# Patient Record
Sex: Female | Born: 1965 | Race: White | Hispanic: No | Marital: Married | Smoking: Never smoker
Health system: Southern US, Community
[De-identification: ages and names within clinical notes are randomized; demographics above are authoritative.]

## PROBLEM LIST (undated history)

## (undated) DIAGNOSIS — L900 Lichen sclerosus et atrophicus: Secondary | ICD-10-CM

## (undated) DIAGNOSIS — R351 Nocturia: Secondary | ICD-10-CM

## (undated) DIAGNOSIS — M1990 Unspecified osteoarthritis, unspecified site: Secondary | ICD-10-CM

## (undated) DIAGNOSIS — R7303 Prediabetes: Secondary | ICD-10-CM

## (undated) DIAGNOSIS — K219 Gastro-esophageal reflux disease without esophagitis: Secondary | ICD-10-CM

## (undated) DIAGNOSIS — D649 Anemia, unspecified: Secondary | ICD-10-CM

## (undated) DIAGNOSIS — R7989 Other specified abnormal findings of blood chemistry: Secondary | ICD-10-CM

## (undated) DIAGNOSIS — L309 Dermatitis, unspecified: Secondary | ICD-10-CM

## (undated) DIAGNOSIS — T8859XA Other complications of anesthesia, initial encounter: Secondary | ICD-10-CM

## (undated) DIAGNOSIS — E785 Hyperlipidemia, unspecified: Secondary | ICD-10-CM

## (undated) DIAGNOSIS — R339 Retention of urine, unspecified: Secondary | ICD-10-CM

## (undated) HISTORY — DX: Dermatitis, unspecified: L30.9

## (undated) HISTORY — PX: UPPER GI ENDOSCOPY: SHX6162

## (undated) HISTORY — DX: Other specified abnormal findings of blood chemistry: R79.89

## (undated) HISTORY — DX: Anemia, unspecified: D64.9

## (undated) HISTORY — DX: Nocturia: R35.1

## (undated) HISTORY — DX: Retention of urine, unspecified: R33.9

## (undated) HISTORY — PX: DILATION AND CURETTAGE, DIAGNOSTIC / THERAPEUTIC: SUR384

## (undated) HISTORY — DX: Gastro-esophageal reflux disease without esophagitis: K21.9

## (undated) HISTORY — PX: ADENOIDECTOMY: SUR15

---

## 2010-06-04 ENCOUNTER — Ambulatory Visit: Payer: Self-pay

## 2011-02-20 ENCOUNTER — Ambulatory Visit: Payer: Self-pay | Admitting: Gastroenterology

## 2011-09-30 ENCOUNTER — Other Ambulatory Visit: Payer: Self-pay | Admitting: Family

## 2011-10-30 ENCOUNTER — Ambulatory Visit: Payer: Self-pay | Admitting: Gastroenterology

## 2011-12-02 ENCOUNTER — Ambulatory Visit: Payer: Self-pay | Admitting: Gastroenterology

## 2011-12-03 LAB — PATHOLOGY REPORT

## 2012-01-13 HISTORY — PX: OTHER SURGICAL HISTORY: SHX169

## 2012-05-31 DIAGNOSIS — R339 Retention of urine, unspecified: Secondary | ICD-10-CM | POA: Insufficient documentation

## 2012-05-31 DIAGNOSIS — R3911 Hesitancy of micturition: Secondary | ICD-10-CM | POA: Insufficient documentation

## 2012-05-31 HISTORY — DX: Retention of urine, unspecified: R33.9

## 2012-09-02 ENCOUNTER — Ambulatory Visit: Payer: Self-pay | Admitting: Urology

## 2013-05-23 DIAGNOSIS — K219 Gastro-esophageal reflux disease without esophagitis: Secondary | ICD-10-CM | POA: Insufficient documentation

## 2013-05-23 DIAGNOSIS — D649 Anemia, unspecified: Secondary | ICD-10-CM | POA: Insufficient documentation

## 2013-05-23 DIAGNOSIS — L309 Dermatitis, unspecified: Secondary | ICD-10-CM | POA: Insufficient documentation

## 2013-05-23 HISTORY — DX: Anemia, unspecified: D64.9

## 2013-08-08 DIAGNOSIS — R351 Nocturia: Secondary | ICD-10-CM | POA: Insufficient documentation

## 2013-08-08 HISTORY — DX: Nocturia: R35.1

## 2013-09-05 ENCOUNTER — Ambulatory Visit: Payer: Self-pay | Admitting: Gastroenterology

## 2013-10-17 DIAGNOSIS — K219 Gastro-esophageal reflux disease without esophagitis: Secondary | ICD-10-CM | POA: Insufficient documentation

## 2014-01-12 HISTORY — PX: COLONOSCOPY: SHX174

## 2014-05-04 NOTE — Op Note (Signed)
PATIENT NAME:  Charlotte Conrad, STAMMEN MR#:  [id number] DATE OF BIRTH:  Dec 27, 1965  DATE OF PROCEDURE:  09/02/2012  PREOPERATIVE DIAGNOSIS: Obstructive voiding symptoms.   POSTOPERATIVE DIAGNOSIS: Obstructive voiding symptoms.   PROCEDURE: Urethral dilation/cystoscopy.   SURGEON: Eileene Kisling C. Bernardo Heater, M.D.   ASSISTANT: None.   ANESTHESIA: General.   INDICATIONS: This is a 49 year old female seen approximately 1 year ago with urinary hesitancy, decreased force stream and sensation of incomplete emptying. She was initially tried on tamsulosin with improvement in her symptoms. Subsequent cystoscopy was performed which showed no significant abnormalities, although after cystoscopy she was able to void much better and discontinue the tamsulosin. She recently returned with recurrent symptoms. She was given a trial on tamsulosin which did not help her symptoms. She presents for urethral dilation and cystoscopy and has requested this be performed under anesthesia.   DESCRIPTION OF PROCEDURE: The patient was taken to the operating room where a general anesthetic was administered. She was placed in the low lithotomy position and her external genitalia were prepped and draped in the usual fashion. Timeout was performed per protocol with all in agreement. The urethra was tied to a [employer] sound. The urethra subsequently dilated to 26-French without difficulty. A 21-French cystoscope sheath with obturator was lubricated and passed per urethra. Panendoscopy was performed. Bladder mucosa was normal in appearance without erythema, solid or papillary lesions. The ureteral orifices were normal-appearing with clear efflux. There was slight oozing of the urethra noted, but no significant bleeding. The bladder was emptied, and the cystoscope was removed. The patient was taken to the PAC-U in stable condition. There were no complications. EBL minimal. ____________________________ Ronda Fairly. Bernardo Heater,  MD scs:sb D: 09/02/2012 08:10:53 ET T: 09/02/2012 08:44:46 ET JOB#: [id number]  cc: Nicki Reaper C. Bernardo Heater, MD, <Dictator> Abbie Sons MD ELECTRONICALLY SIGNED 09/14/2012 11:14

## 2014-05-08 ENCOUNTER — Ambulatory Visit
Admit: 2014-05-08 | Disposition: A | Discharge status: Home / Self Care | Payer: Self-pay | Attending: Gastroenterology | Admitting: Gastroenterology

## 2014-10-24 DIAGNOSIS — L309 Dermatitis, unspecified: Secondary | ICD-10-CM

## 2014-10-24 DIAGNOSIS — R7989 Other specified abnormal findings of blood chemistry: Secondary | ICD-10-CM

## 2014-10-24 HISTORY — DX: Dermatitis, unspecified: L30.9

## 2014-10-24 HISTORY — DX: Other specified abnormal findings of blood chemistry: R79.89

## 2014-12-10 ENCOUNTER — Other Ambulatory Visit: Payer: Self-pay | Admitting: Gastroenterology

## 2015-05-11 IMAGING — RF DG UGI W/O KUB
8 of 10 series · 14 of 24 positions shown · non-contrast
Comparison: None.

CLINICAL DATA: Reflux

EXAM:
UPPER GI SERIES WITHOUT KUB
TECHNIQUE: Routine upper GI series was performed with thin and thick barium.
FLUOROSCOPY TIME:  54 seconds

[Series 1: fluoro_barium 2fps_bw · 0.17mm/px · 2 of 12 frames shown (1 of 7)]
[frame 2/12]
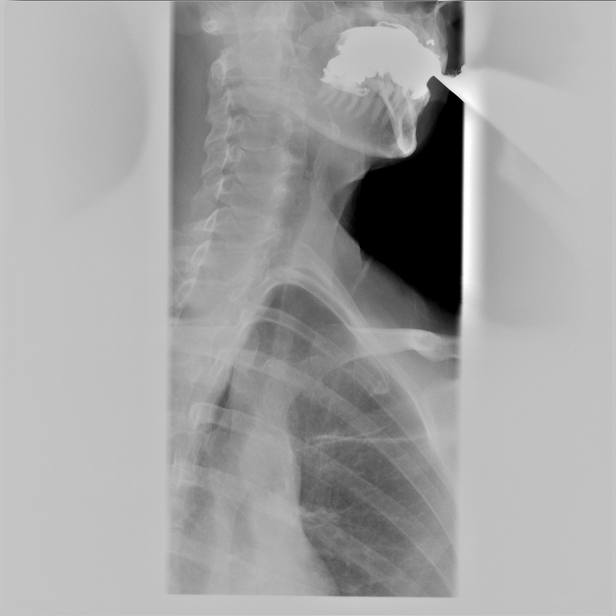
[frame 11/12]
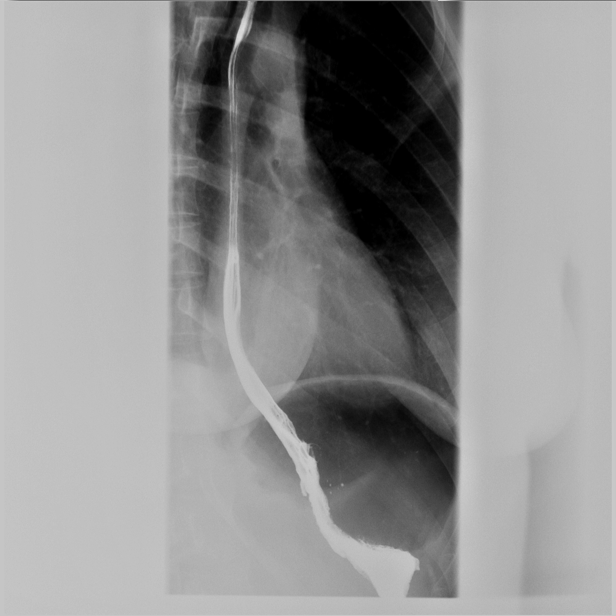

[Series 2: fluoro_barium 2fps_bw · 0.17mm/px · 1 of 17 frames shown (2 of 7)]
[frame 15/17]
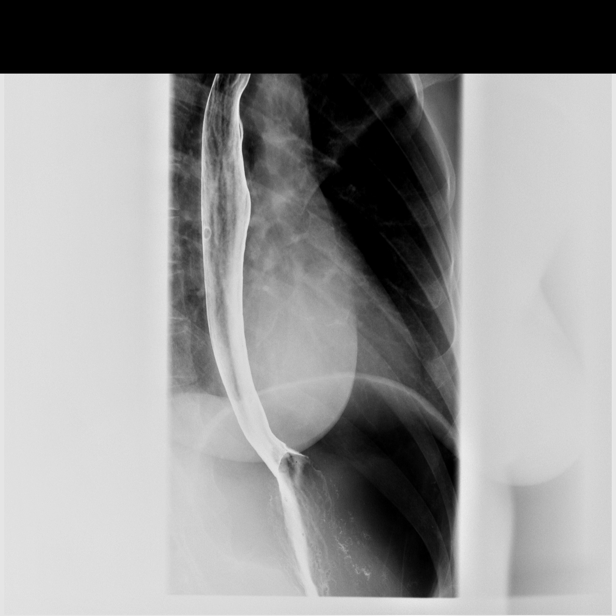

[Series 3: cp_standard · 0.26mm/px · 5 of 9 slices shown]
[im 1/9]
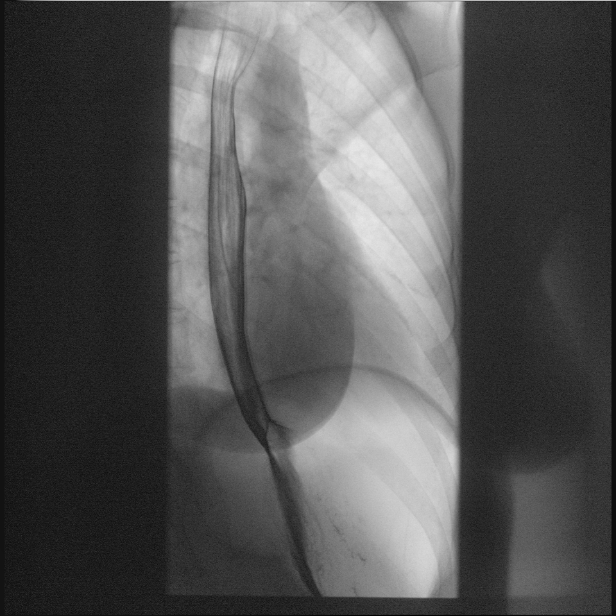
[im 2/9]
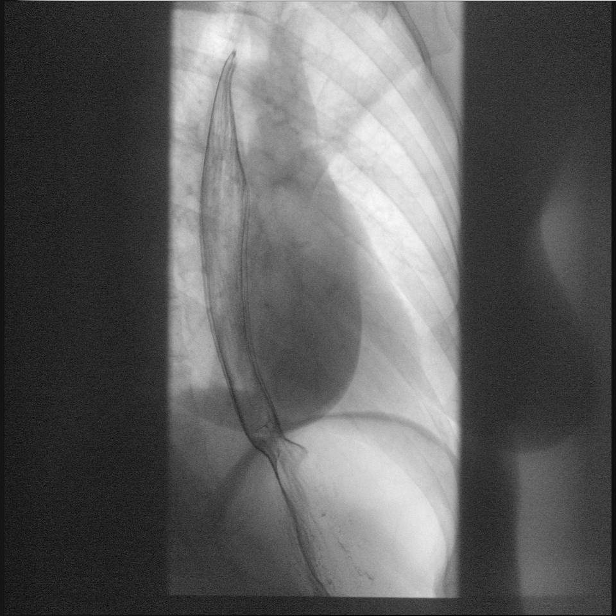
[im 4/9]
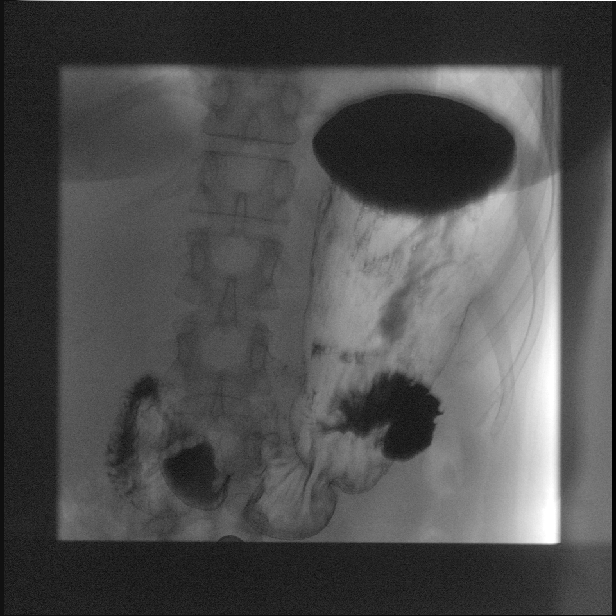
[im 6/9]
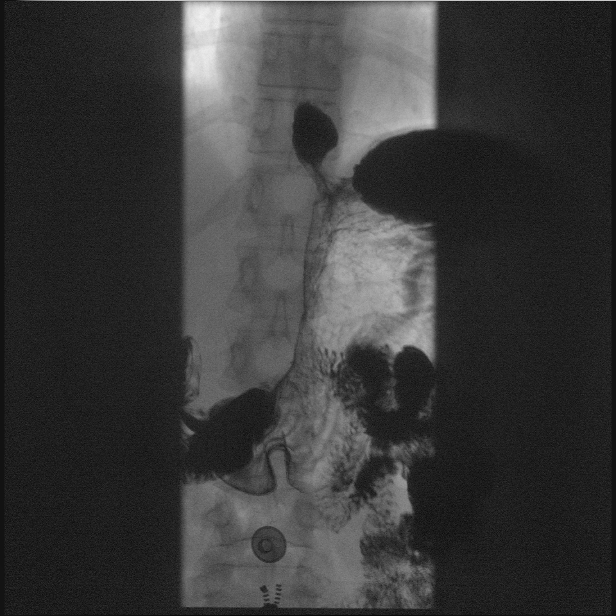
[im 8/9]
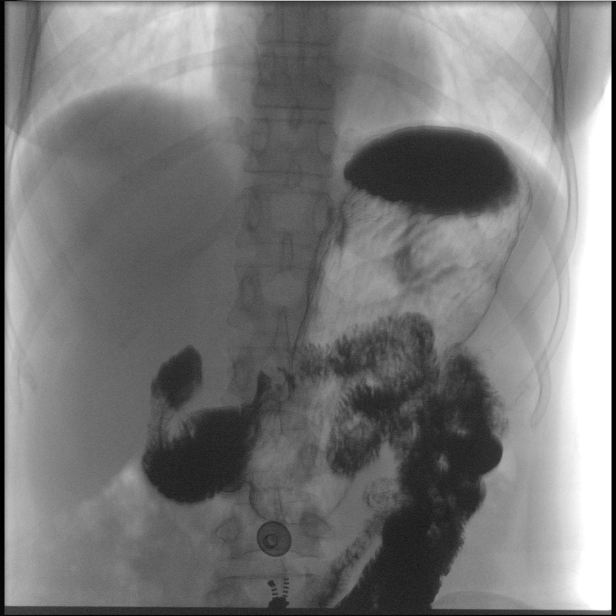

[Series 7: fluoro_barium 2fps_bw · 0.18mm/px · 1 of 1 slices shown (3 of 7)]
[im 1/1]
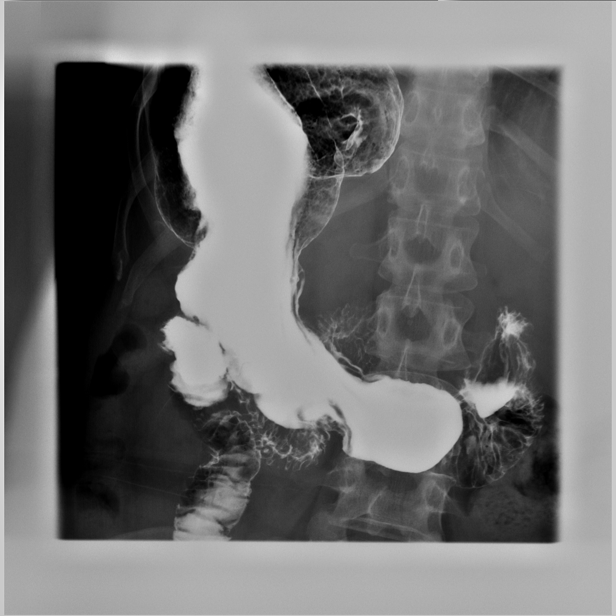

[Series 9: fluoro_barium 2fps_bw · 0.18mm/px · 1 of 1 slices shown (4 of 7)]
[im 1/1]
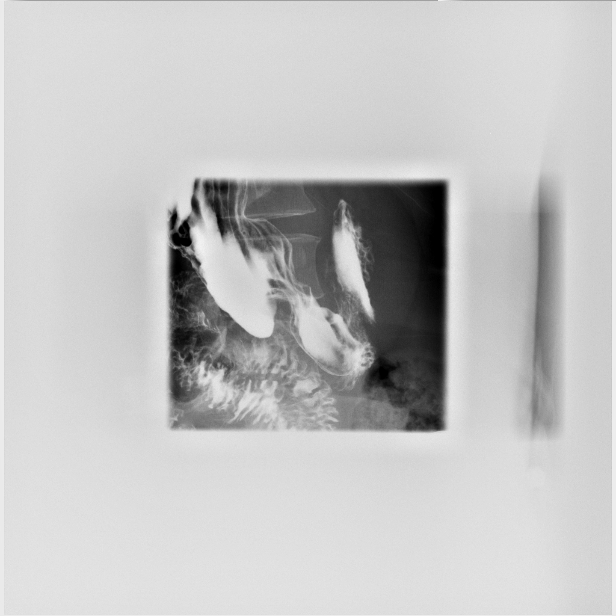

[Series 11: fluoro_barium 2fps_bw · 0.18mm/px · 1 of 1 slices shown (5 of 7)]
[im 1/1]
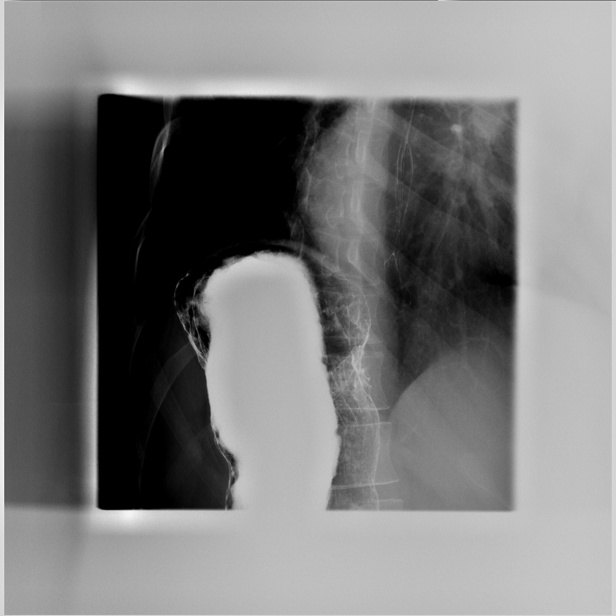

[Series 12: fluoro_barium 2fps_bw · 0.18mm/px · 1 of 1 slices shown (6 of 7)]
[im 1/1]
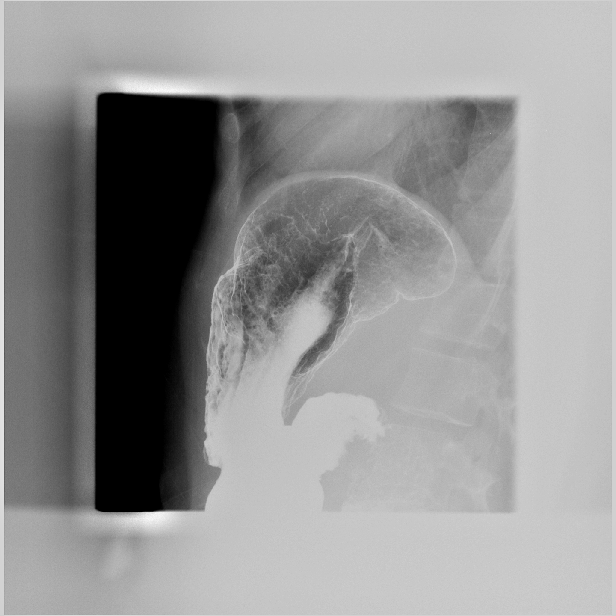

[Series 15: fluoro_barium 2fps_bw · 0.18mm/px · 2 of 16 frames shown (7 of 7)]
[frame 5/16]
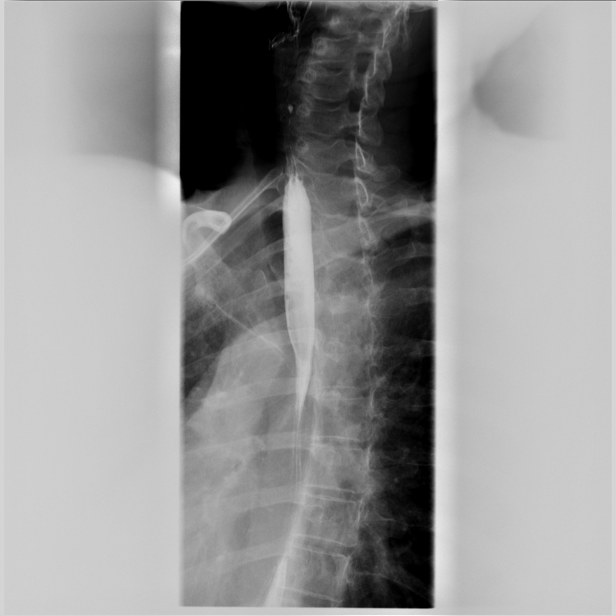
[frame 14/16]
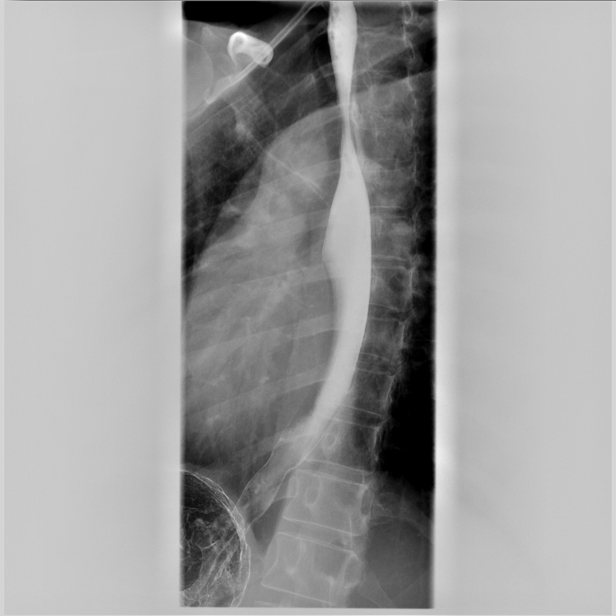

[14 of 24 positions shown; findings below may reference images not displayed]

FINDINGS: Examination of the esophagus demonstrated normal esophageal
motility. Normal esophageal morphology without evidence of
esophagitis or ulceration. No esophageal stricture, diverticula, or
mass lesion. No evidence of hiatal hernia. There is mild spontaneous
gastroesophageal reflux.

Examination of the stomach demonstrated normal rugal folds and areae
gastricae. The gastric mucosa appeared unremarkable without evidence
of ulceration, scarring, or mass lesion. Gastric motility and
emptying was normal. Fluoroscopic examination of the duodenum
demonstrates normal motility and morphology without evidence of
ulceration or mass lesion.

At the end of the examination a 13 mm barium tablet was administered
which transited through the esophagus and esophagogastric junction
without delay.
IMPRESSION: 1. Mild gastroesophageal reflux.  Otherwise unremarkable upper GI.

## 2015-11-11 ENCOUNTER — Other Ambulatory Visit: Payer: Self-pay

## 2015-11-12 ENCOUNTER — Ambulatory Visit (INDEPENDENT_AMBULATORY_CARE_PROVIDER_SITE_OTHER): Payer: BC Managed Care – PPO | Admitting: Gastroenterology

## 2015-11-12 ENCOUNTER — Encounter: Payer: Self-pay | Admitting: Gastroenterology

## 2015-11-12 VITALS — BP 105/72 | HR 84 | Temp 98.1°F | Ht 64.5 in | Wt 112.8 lb

## 2015-11-12 DIAGNOSIS — K219 Gastro-esophageal reflux disease without esophagitis: Secondary | ICD-10-CM

## 2015-11-12 MED ORDER — DEXLANSOPRAZOLE 60 MG PO CPDR: 1.0000 | DELAYED_RELEASE_CAPSULE | Freq: Every day | ORAL | 11 refills | Status: DC

## 2015-11-12 NOTE — Patient Instructions (Signed)
You have been given a refill on your Dexilant 60mg  today. This has been sent to your pharmacy. Please let me know if you have any questions or concerns.

## 2015-11-12 NOTE — Progress Notes (Signed)
   Primary Care Physician: Glendon Axe, MD  Primary Gastroenterologist:  Dr. Lucilla Lame  Chief Complaint  Patient presents with  . Medication Refill    HPI: Charlotte Conrad is a 50 y.o. female here for follow-up of her reflux. The patient states she has been doing well on Dexilant. The patient states she has some acid breakthrough when she doesn't eat right. There is no report of any further dysphagia with her history of having esophageal dilation in the past by Dr. Candace Cruise. The patient has been doing well without any weight loss fevers chills nausea vomiting. The patient just needs a refill of her medications.  Current Outpatient Prescriptions  Medication Sig Dispense Refill  . Cranberry 500 MG CAPS Take by mouth.    . DEXILANT 60 MG capsule Take one capsule by mouth daily 30 capsule 11  . loratadine (CLARITIN) 10 MG tablet Take 10 mg by mouth.    . Loratadine 10 MG CAPS Take by mouth.    . Multiple Vitamin (MULTI-VITAMINS) TABS Take by mouth.    . norethindrone-ethinyl estradiol 1/35 (NORTREL 1/35, 28,) tablet     . pantoprazole (PROTONIX) 40 MG tablet Take 40 mg by mouth.    . tamsulosin (FLOMAX) 0.4 MG CAPS capsule TAKE ONE CAPSULE BY MOUTH DAILY    . triamcinolone cream (KENALOG) 0.1 % Apply topically.    . vitamin C (ASCORBIC ACID) 500 MG tablet Take 500 mg by mouth.    . fluticasone (FLONASE) 50 MCG/ACT nasal spray Place into the nose.    . ranitidine (ZANTAC) 300 MG capsule Take 300 mg by mouth.     No current facility-administered medications for this visit.     Allergies as of 11/12/2015 - Review Complete 11/12/2015  Allergen Reaction Noted  . Esomeprazole magnesium  08/08/2013  . Etodolac Other (See Comments) 04/01/2013  . Levofloxacin Other (See Comments) 05/27/2012  . Sulfa antibiotics  05/30/2012    ROS:  General: Negative for anorexia, weight loss, fever, chills, fatigue, weakness. ENT: Negative for hoarseness, difficulty swallowing , nasal congestion. CV:  Negative for chest pain, angina, palpitations, dyspnea on exertion, peripheral edema.  Respiratory: Negative for dyspnea at rest, dyspnea on exertion, cough, sputum, wheezing.  GI: See history of present illness. GU:  Negative for dysuria, hematuria, urinary incontinence, urinary frequency, nocturnal urination.  Endo: Negative for unusual weight change.    Physical Examination:   BP 105/72   Pulse 84   Temp 98.1 F (36.7 C) (Oral)   Ht 5' 4.5" (1.638 m)   Wt 112 lb 12.8 oz (51.2 kg)   BMI 19.06 kg/m   General: Well-nourished, well-developed in no acute distress.  Eyes: No icterus. Conjunctivae pink. Neuro: Alert and oriented x 3.  Grossly intact. Skin: Warm and dry, no jaundice.   Psych: Alert and cooperative, normal mood and affect.  Labs:    Imaging Studies: No results found.  Assessment and Plan:   Charlotte Conrad is a 50 y.o. y/o female who comes in today with no new symptoms and has been doing well on Dexilant. The patient will be given refills for the next year of her Dexilant. The patient is also been told to contact me if any further symptoms change. The patient has been explained the plan and agrees with it.   Note: This dictation was prepared with Dragon dictation along with smaller phrase technology. Any transcriptional errors that result from this process are unintentional.

## 2016-08-11 ENCOUNTER — Other Ambulatory Visit: Payer: Self-pay

## 2016-08-11 ENCOUNTER — Telehealth: Payer: Self-pay | Admitting: Gastroenterology

## 2016-08-11 DIAGNOSIS — R1310 Dysphagia, unspecified: Secondary | ICD-10-CM

## 2016-08-11 DIAGNOSIS — R1319 Other dysphagia: Secondary | ICD-10-CM

## 2016-08-11 NOTE — Telephone Encounter (Signed)
Patient left a voice message that she is having trouble swallowing. Had EGD in 2016. Please call [phone]

## 2016-08-12 NOTE — Telephone Encounter (Signed)
Pt has been scheduled for an EGD at [facility] on 08/27/16.

## 2016-08-17 ENCOUNTER — Encounter: Payer: Self-pay | Admitting: *Deleted

## 2016-08-24 NOTE — Discharge Instructions (Signed)
General Anesthesia, Adult, Care After °These instructions provide you with information about caring for yourself after your procedure. Your health care provider may also give you more specific instructions. Your treatment has been planned according to current medical practices, but problems sometimes occur. Call your health care provider if you have any problems or questions after your procedure. °What can I expect after the procedure? °After the procedure, it is common to have: °· Vomiting. °· A sore throat. °· Mental slowness. ° °It is common to feel: °· Nauseous. °· Cold or shivery. °· Sleepy. °· Tired. °· Sore or achy, even in parts of your body where you did not have surgery. ° °Follow these instructions at home: °For at least 24 hours after the procedure: °· Do not: °? Participate in activities where you could fall or become injured. °? Drive. °? Use heavy machinery. °? Drink alcohol. °? Take sleeping pills or medicines that cause drowsiness. °? Make important decisions or sign legal documents. °? Take care of children on your own. °· Rest. °Eating and drinking °· If you vomit, drink water, juice, or soup when you can drink without vomiting. °· Drink enough fluid to keep your urine clear or pale yellow. °· Make sure you have little or no nausea before eating solid foods. °· Follow the diet recommended by your health care provider. °General instructions °· Have a responsible adult stay with you until you are awake and alert. °· Return to your normal activities as told by your health care provider. Ask your health care provider what activities are safe for you. °· Take over-the-counter and prescription medicines only as told by your health care provider. °· If you smoke, do not smoke without supervision. °· Keep all follow-up visits as told by your health care provider. This is important. °Contact a health care provider if: °· You continue to have nausea or vomiting at home, and medicines are not helpful. °· You  cannot drink fluids or start eating again. °· You cannot urinate after 8-12 hours. °· You develop a skin rash. °· You have fever. °· You have increasing redness at the site of your procedure. °Get help right away if: °· You have difficulty breathing. °· You have chest pain. °· You have unexpected bleeding. °· You feel that you are having a life-threatening or urgent problem. °This information is not intended to replace advice given to you by your health care provider. Make sure you discuss any questions you have with your health care provider. °Document Released: 04/06/2000 Document Revised: 06/03/2015 Document Reviewed: 12/13/2014 °Elsevier Interactive Patient Education © 2018 Elsevier Inc. ° °

## 2016-08-27 ENCOUNTER — Ambulatory Visit: Payer: BC Managed Care – PPO | Admitting: Anesthesiology

## 2016-08-27 ENCOUNTER — Encounter
Admission: RE | Disposition: A | Discharge status: Home / Self Care | Payer: Self-pay | Source: Ambulatory Visit | Attending: Gastroenterology

## 2016-08-27 ENCOUNTER — Ambulatory Visit
Admission: RE | Admit: 2016-08-27 | Discharge: 2016-08-27 | Disposition: A | Discharge status: Home / Self Care | Payer: BC Managed Care – PPO | Source: Ambulatory Visit | Attending: Gastroenterology | Admitting: Gastroenterology

## 2016-08-27 DIAGNOSIS — K219 Gastro-esophageal reflux disease without esophagitis: Secondary | ICD-10-CM | POA: Insufficient documentation

## 2016-08-27 DIAGNOSIS — R1310 Dysphagia, unspecified: Secondary | ICD-10-CM | POA: Insufficient documentation

## 2016-08-27 HISTORY — PX: ESOPHAGEAL DILATION: SHX303

## 2016-08-27 HISTORY — PX: ESOPHAGOGASTRODUODENOSCOPY (EGD) WITH PROPOFOL: SHX5813

## 2016-08-27 SURGERY — ESOPHAGOGASTRODUODENOSCOPY (EGD) WITH PROPOFOL
Anesthesia: General | Wound class: Contaminated

## 2016-08-27 MED ORDER — ACETAMINOPHEN 160 MG/5ML PO SOLN: 325.0000 mg | ORAL | Status: DC | PRN

## 2016-08-27 MED ORDER — LACTATED RINGERS IV SOLN
INTRAVENOUS | Status: DC
  Administered 2016-08-27: 09:00:00 via INTRAVENOUS

## 2016-08-27 MED ORDER — GLYCOPYRROLATE 0.2 MG/ML IJ SOLN
INTRAMUSCULAR | Status: DC | PRN
  Administered 2016-08-27: 0.1 mg via INTRAVENOUS

## 2016-08-27 MED ORDER — PROPOFOL 10 MG/ML IV BOLUS
INTRAVENOUS | Status: DC | PRN
  Administered 2016-08-27: 50 mg via INTRAVENOUS
  Administered 2016-08-27: 30 mg via INTRAVENOUS
  Administered 2016-08-27 (×3): 50 mg via INTRAVENOUS

## 2016-08-27 MED ORDER — LIDOCAINE HCL (CARDIAC) 20 MG/ML IV SOLN
INTRAVENOUS | Status: DC | PRN
  Administered 2016-08-27: 10 mg via INTRAVENOUS

## 2016-08-27 MED ORDER — STERILE WATER FOR IRRIGATION IR SOLN
Status: DC | PRN
  Administered 2016-08-27: 10:00:00

## 2016-08-27 MED ORDER — ACETAMINOPHEN 325 MG PO TABS: 325.0000 mg | ORAL_TABLET | ORAL | Status: DC | PRN

## 2016-08-27 SURGICAL SUPPLY — 32 items
BALLN DILATOR 10-12 8 (BALLOONS)
BALLN DILATOR 12-15 8 (BALLOONS)
BALLN DILATOR 15-18 8 (BALLOONS) ×3
BALLN DILATOR CRE 0-12 8 (BALLOONS)
BALLN DILATOR ESOPH 8 10 CRE (MISCELLANEOUS) IMPLANT
BALLOON DILATOR 12-15 8 (BALLOONS) IMPLANT
BALLOON DILATOR 15-18 8 (BALLOONS) ×1 IMPLANT
BALLOON DILATOR CRE 0-12 8 (BALLOONS) IMPLANT
BLOCK BITE 60FR ADLT L/F GRN (MISCELLANEOUS) ×3 IMPLANT
CANISTER SUCT 1200ML W/VALVE (MISCELLANEOUS) ×3 IMPLANT
CLIP HMST 235XBRD CATH ROT (MISCELLANEOUS) IMPLANT
CLIP RESOLUTION 360 11X235 (MISCELLANEOUS)
FCP ESCP3.2XJMB 240X2.8X (MISCELLANEOUS)
FORCEPS BIOP RAD 4 LRG CAP 4 (CUTTING FORCEPS) IMPLANT
FORCEPS BIOP RJ4 240 W/NDL (MISCELLANEOUS)
FORCEPS ESCP3.2XJMB 240X2.8X (MISCELLANEOUS) IMPLANT
GOWN CVR UNV OPN BCK APRN NK (MISCELLANEOUS) ×2 IMPLANT
GOWN ISOL THUMB LOOP REG UNIV (MISCELLANEOUS) ×4
INJECTOR VARIJECT VIN23 (MISCELLANEOUS) IMPLANT
KIT DEFENDO VALVE AND CONN (KITS) IMPLANT
KIT ENDO PROCEDURE OLY (KITS) ×3 IMPLANT
MARKER SPOT ENDO TATTOO 5ML (MISCELLANEOUS) IMPLANT
PAD GROUND ADULT SPLIT (MISCELLANEOUS) IMPLANT
RETRIEVER NET PLAT FOOD (MISCELLANEOUS) IMPLANT
SNARE SHORT THROW 13M SML OVAL (MISCELLANEOUS) IMPLANT
SNARE SHORT THROW 30M LRG OVAL (MISCELLANEOUS) IMPLANT
SPOT EX ENDOSCOPIC TATTOO (MISCELLANEOUS)
SYR INFLATION 60ML (SYRINGE) ×3 IMPLANT
TRAP ETRAP POLY (MISCELLANEOUS) IMPLANT
VARIJECT INJECTOR VIN23 (MISCELLANEOUS)
WATER STERILE IRR 250ML POUR (IV SOLUTION) ×3 IMPLANT
WIRE CRE 18-20MM 8CM F G (MISCELLANEOUS) IMPLANT

## 2016-08-27 NOTE — Transfer of Care (Signed)
Immediate Anesthesia Transfer of Care Note  Patient: Charlotte Conrad  Procedure(s) Performed: Procedure(s): ESOPHAGOGASTRODUODENOSCOPY (EGD) WITH PROPOFOL (N/A) ESOPHAGEAL DILATION (N/A)  Patient Location: PACU  Anesthesia Type: General  Level of Consciousness: awake, alert  and patient cooperative  Airway and Oxygen Therapy: Patient Spontanous Breathing and Patient connected to supplemental oxygen  Post-op Assessment: Post-op Vital signs reviewed, Patient's Cardiovascular Status Stable, Respiratory Function Stable, Patent Airway and No signs of Nausea or vomiting  Post-op Vital Signs: Reviewed and stable  Complications: No apparent anesthesia complications

## 2016-08-27 NOTE — Anesthesia Postprocedure Evaluation (Signed)
Anesthesia Post Note  Patient: Charlotte Conrad  Procedure(s) Performed: Procedure(s) (LRB): ESOPHAGOGASTRODUODENOSCOPY (EGD) WITH PROPOFOL (N/A) ESOPHAGEAL DILATION (N/A)  Patient location during evaluation: PACU Anesthesia Type: General Level of consciousness: awake and alert and oriented Pain management: satisfactory to patient Vital Signs Assessment: post-procedure vital signs reviewed and stable Respiratory status: spontaneous breathing, nonlabored ventilation and respiratory function stable Cardiovascular status: blood pressure returned to baseline and stable Postop Assessment: Adequate PO intake and No signs of nausea or vomiting Anesthetic complications: no    Raliegh Ip

## 2016-08-27 NOTE — Op Note (Signed)
[facility] Gastroenterology Patient Name: Charlotte Conrad Procedure Date: 08/27/2016 9:31 AM MRN: [id number] Account #: [id number] Date of Birth: May 28, 1965 Admit Type: Outpatient Age: 51 Room: [facility] OR ROOM 01 Gender: Female Note Status: Finalized Procedure:            Upper GI endoscopy Indications:          Dysphagia Providers:            Lucilla Lame MD, MD Referring MD:         Glendon Axe (Referring MD) Medicines:            Propofol per Anesthesia Complications:        No immediate complications. Procedure:            Pre-Anesthesia Assessment:                       - Prior to the procedure, a History and Physical was                        performed, and patient medications and allergies were                        reviewed. The patient's tolerance of previous                        anesthesia was also reviewed. The risks and benefits of                        the procedure and the sedation options and risks were                        discussed with the patient. All questions were                        answered, and informed consent was obtained. Prior                        Anticoagulants: The patient has taken no previous                        anticoagulant or antiplatelet agents. ASA Grade                        Assessment: II - A patient with mild systemic disease.                        After reviewing the risks and benefits, the patient was                        deemed in satisfactory condition to undergo the                        procedure.                       After obtaining informed consent, the endoscope was                        passed under direct vision. Throughout the procedure,  the patient's blood pressure, pulse, and oxygen                        saturations were monitored continuously. The Olympus                        GIF H180J Endoscope (G#:[id number]) was introduced through                        the mouth,  and advanced to the second part of duodenum.                        The upper GI endoscopy was accomplished without                        difficulty. The patient tolerated the procedure well. Findings:      The examined esophagus was normal. Two biopsies were obtained in the       middle third of the esophagus with cold forceps for histology. A TTS       dilator was passed through the scope. Dilation with a 15-16.5-18 mm       balloon dilator was performed to 18 mm.      The stomach was normal.      The examined duodenum was normal. Impression:           - Normal esophagus. Dilated.                       - Normal stomach.                       - Normal examined duodenum.                       - Two biopsies were obtained in the middle third of the                        esophagus. Recommendation:       - Discharge patient to home.                       - Resume previous diet.                       - Continue present medications.                       - Await pathology results. Procedure Code(s):    --- Professional ---                       [phone], Esophagogastroduodenoscopy, flexible, transoral;                        with transendoscopic balloon dilation of esophagus                        (less than 30 mm diameter)                       43239, Esophagogastroduodenoscopy, flexible, transoral;                        with biopsy, single  or multiple Diagnosis Code(s):    --- Professional ---                       R13.10, Dysphagia, unspecified CPT copyright 2016 American Medical Association. All rights reserved. The codes documented in this report are preliminary and upon coder review may  be revised to meet current compliance requirements. Lucilla Lame MD, MD 08/27/2016 9:54:27 AM This report has been signed electronically. Number of Addenda: 0 Note Initiated On: 08/27/2016 9:31 AM      [facility]

## 2016-08-27 NOTE — Anesthesia Preprocedure Evaluation (Signed)
Anesthesia Evaluation  Patient identified by MRN, date of birth, ID band Patient awake    Reviewed: Allergy & Precautions, H&P , NPO status , Patient's Chart, lab work & pertinent test results  Airway Mallampati: II  TM Distance: >3 FB Neck ROM: full    Dental no notable dental hx.    Pulmonary    Pulmonary exam normal breath sounds clear to auscultation       Cardiovascular Normal cardiovascular exam Rhythm:regular Rate:Normal     Neuro/Psych    GI/Hepatic GERD  ,  Endo/Other    Renal/GU      Musculoskeletal   Abdominal   Peds  Hematology   Anesthesia Other Findings   Reproductive/Obstetrics                             Anesthesia Physical Anesthesia Plan  ASA: II  Anesthesia Plan: General   Post-op Pain Management:    Induction:   PONV Risk Score and Plan: 3 and Propofol infusion  Airway Management Planned:   Additional Equipment:   Intra-op Plan:   Post-operative Plan:   Informed Consent: I have reviewed the patients History and Physical, chart, labs and discussed the procedure including the risks, benefits and alternatives for the proposed anesthesia with the patient or authorized representative who has indicated his/her understanding and acceptance.     Plan Discussed with: CRNA  Anesthesia Plan Comments:         Anesthesia Quick Evaluation  

## 2016-08-27 NOTE — H&P (Signed)
Lucilla Lame, MD [address] [place], [place] [address] Phone:[phone] Fax : [phone]  Primary Care Physician:  Glendon Axe, MD Primary Gastroenterologist:  Dr. Allen Norris  Pre-Procedure History & Physical: HPI:  Charlotte Conrad is a 51 y.o. female is here for an endoscopy.   Past Medical History:  Diagnosis Date  . Anemia 05/23/2013  . Eczema 10/24/2014  . Elevated TSH 10/24/2014  . GERD (gastroesophageal reflux disease)   . Incomplete emptying of bladder 05/31/2012  . Nocturia 08/08/2013    Past Surgical History:  Procedure Laterality Date  . ADENOIDECTOMY    . DILATION AND CURETTAGE, DIAGNOSTIC / THERAPEUTIC    . UPPER GI ENDOSCOPY     02/01/2003, 02/19//2013, 12/02/2011    Prior to Admission medications   Medication Sig Start Date End Date Taking? Authorizing Provider  CALCIUM PO Take by mouth daily.   Yes [provider]  Cranberry 500 MG CAPS Take by mouth.   Yes [provider]  dexlansoprazole (DEXILANT) 60 MG capsule Take 1 capsule (60 mg total) by mouth daily. 11/12/15  Yes Lucilla Lame, MD  DOCUSATE SODIUM PO Take by mouth daily as needed.   Yes [provider]  loratadine (CLARITIN) 10 MG tablet Take 10 mg by mouth.   Yes [provider]  Multiple Vitamin (MULTI-VITAMINS) TABS Take by mouth.   Yes [provider]  norethindrone-ethinyl estradiol (JUNEL FE,GILDESS FE,LOESTRIN FE) 1-20 MG-MCG tablet Take 1 tablet by mouth daily.   Yes [provider]  triamcinolone cream (KENALOG) 0.1 % Apply topically.   Yes [provider]  vitamin C (ASCORBIC ACID) 500 MG tablet Take 500 mg by mouth.   Yes [provider]  fluticasone (FLONASE) 50 MCG/ACT nasal spray Place into the nose. 03/01/14   [provider]    Allergies as of 08/11/2016 - Review Complete 11/12/2015  Allergen Reaction Noted  . Esomeprazole magnesium  08/08/2013  . Etodolac Other (See Comments) 04/01/2013   . Levofloxacin Other (See Comments) 05/27/2012  . Sulfa antibiotics  05/30/2012    Family History  Problem Relation Age of Onset  . Hypertension Mother   . Cancer Mother   . Diabetes Mellitus II Father   . Melanoma Father   . Diabetes Sister   . Hypertension Sister   . Stroke Maternal Grandfather     Social History   Social History  . Marital status: Married    Spouse name: N/A  . Number of children: N/A  . Years of education: N/A   Occupational History  . Not on file.   Social History Main Topics  . Smoking status: Never Smoker  . Smokeless tobacco: Never Used  . Alcohol use No  . Drug use: No  . Sexual activity: Not on file   Other Topics Concern  . Not on file   Social History Narrative  . No narrative on file    Review of Systems: See HPI, otherwise negative ROS  Physical Exam: BP 121/82   Pulse 77   Resp 16   Ht 5' 4.5" (1.638 m)   Wt 112 lb (50.8 kg)   LMP 08/17/2016 (Exact Date) Comment: Preg Test Negative  SpO2 100%   BMI 18.93 kg/m  General:   Alert,  pleasant and cooperative in NAD Head:  Normocephalic and atraumatic. Neck:  Supple; no masses or thyromegaly. Lungs:  Clear throughout to auscultation.    Heart:  Regular rate and rhythm. Abdomen:  Soft, nontender and nondistended. Normal  bowel sounds, without guarding, and without rebound.   Neurologic:  Alert and  oriented x4;  grossly normal neurologically.  Impression/Plan: Charlotte Conrad is here for an endoscopy to be performed for dysphagia  Risks, benefits, limitations, and alternatives regarding  endoscopy have been reviewed with the patient.  Questions have been answered.  All parties agreeable.   Lucilla Lame, MD  08/27/2016, 9:35 AM

## 2016-08-28 ENCOUNTER — Encounter: Payer: Self-pay | Admitting: Gastroenterology

## 2016-08-29 ENCOUNTER — Encounter: Payer: Self-pay | Admitting: Gastroenterology

## 2016-10-27 ENCOUNTER — Other Ambulatory Visit: Payer: Self-pay | Admitting: Gastroenterology

## 2017-05-25 ENCOUNTER — Other Ambulatory Visit: Payer: Self-pay | Admitting: Gastroenterology

## 2017-09-08 ENCOUNTER — Encounter: Payer: Self-pay | Admitting: Gastroenterology

## 2017-09-08 ENCOUNTER — Ambulatory Visit: Payer: BC Managed Care – PPO | Admitting: Gastroenterology

## 2017-09-08 ENCOUNTER — Encounter (INDEPENDENT_AMBULATORY_CARE_PROVIDER_SITE_OTHER): Payer: Self-pay

## 2017-09-08 VITALS — BP 116/69 | HR 77 | Ht 64.5 in | Wt 112.0 lb

## 2017-09-08 DIAGNOSIS — R350 Frequency of micturition: Secondary | ICD-10-CM | POA: Insufficient documentation

## 2017-09-08 DIAGNOSIS — K219 Gastro-esophageal reflux disease without esophagitis: Secondary | ICD-10-CM | POA: Diagnosis not present

## 2017-09-08 DIAGNOSIS — D259 Leiomyoma of uterus, unspecified: Secondary | ICD-10-CM | POA: Insufficient documentation

## 2017-09-08 DIAGNOSIS — R87619 Unspecified abnormal cytological findings in specimens from cervix uteri: Secondary | ICD-10-CM | POA: Insufficient documentation

## 2017-09-08 DIAGNOSIS — N390 Urinary tract infection, site not specified: Secondary | ICD-10-CM | POA: Insufficient documentation

## 2017-09-08 MED ORDER — DEXLANSOPRAZOLE 60 MG PO CPDR: 1.0000 | DELAYED_RELEASE_CAPSULE | Freq: Every day | ORAL | 11 refills | Status: DC

## 2017-09-08 NOTE — Progress Notes (Signed)
    Primary Care Physician: Glendon Axe, MD  Primary Gastroenterologist:  Dr. Lucilla Lame  Chief Complaint  Patient presents with  . Medication Refill    HPI: Charlotte Conrad is a 52 y.o. female here for follow-up of her heartburn.  The patient reports that her heartburn is well controlled unless she eats something that does not agree with her. There is no report of any dysphagia nausea vomiting fevers chills black stools or bloody stools.  The patient mainly needs a refill of her prescription of Dexilant.  Current Outpatient Medications  Medication Sig Dispense Refill  . CALCIUM PO Take by mouth daily.    . Cranberry 500 MG CAPS Take by mouth.    . dexlansoprazole (DEXILANT) 60 MG capsule Take 1 capsule (60 mg total) by mouth daily. 30 capsule 11  . loratadine (CLARITIN) 10 MG tablet Take 10 mg by mouth.    . Multiple Vitamin (MULTI-VITAMINS) TABS Take by mouth.    . norethindrone-ethinyl estradiol (JUNEL FE,GILDESS FE,LOESTRIN FE) 1-20 MG-MCG tablet Take 1 tablet by mouth daily.    Marland Kitchen DOCUSATE SODIUM PO Take by mouth daily as needed.    . fluticasone (FLONASE) 50 MCG/ACT nasal spray Place into the nose.    . triamcinolone cream (KENALOG) 0.1 % Apply topically.    . vitamin C (ASCORBIC ACID) 500 MG tablet Take 500 mg by mouth.     No current facility-administered medications for this visit.     Allergies as of 09/08/2017 - Review Complete 09/08/2017  Allergen Reaction Noted  . Esomeprazole magnesium Other (See Comments) 08/08/2013  . Etodolac Other (See Comments) 04/01/2013  . Levofloxacin Other (See Comments) 05/27/2012  . Sulfa antibiotics Other (See Comments) 05/30/2012    ROS:  General: Negative for anorexia, weight loss, fever, chills, fatigue, weakness. ENT: Negative for hoarseness, difficulty swallowing , nasal congestion. CV: Negative for chest pain, angina, palpitations, dyspnea on exertion, peripheral edema.  Respiratory: Negative for dyspnea at rest, dyspnea on  exertion, cough, sputum, wheezing.  GI: See history of present illness. GU:  Negative for dysuria, hematuria, urinary incontinence, urinary frequency, nocturnal urination.  Endo: Negative for unusual weight change.    Physical Examination:   BP 116/69   Pulse 77   Ht 5' 4.5" (1.638 m)   Wt 112 lb (50.8 kg)   BMI 18.93 kg/m   General: Well-nourished, well-developed in no acute distress.  Eyes: No icterus. Conjunctivae pink. Neuro: Alert and oriented x 3.  Grossly intact. Skin: Warm and dry, no jaundice.   Psych: Alert and cooperative, normal mood and affect.  Labs:    Imaging Studies: No results found.  Assessment and Plan:   Charlotte Conrad is a 52 y.o. y/o female with chronic heartburn who is well controlled with her present medication.  The patient has no worrisome symptoms such as dysphagia or weight loss.  The patient is also not due for a colonoscopy.  The patient will have her medication refilled at this time.    Lucilla Lame, MD. Marval Regal   Note: This dictation was prepared with Dragon dictation along with smaller phrase technology. Any transcriptional errors that result from this process are unintentional.

## 2018-08-09 ENCOUNTER — Other Ambulatory Visit: Payer: Self-pay | Admitting: Gastroenterology

## 2018-08-22 ENCOUNTER — Ambulatory Visit: Payer: BC Managed Care – PPO | Admitting: Gastroenterology

## 2018-09-08 ENCOUNTER — Encounter: Payer: Self-pay | Admitting: Gastroenterology

## 2018-09-08 ENCOUNTER — Other Ambulatory Visit: Payer: Self-pay

## 2018-09-08 ENCOUNTER — Ambulatory Visit (INDEPENDENT_AMBULATORY_CARE_PROVIDER_SITE_OTHER): Payer: BC Managed Care – PPO | Admitting: Gastroenterology

## 2018-09-08 VITALS — BP 117/74 | HR 88 | Temp 98.0°F | Ht 63.0 in | Wt 123.4 lb

## 2018-09-08 DIAGNOSIS — K219 Gastro-esophageal reflux disease without esophagitis: Secondary | ICD-10-CM | POA: Diagnosis not present

## 2018-09-08 DIAGNOSIS — R4702 Dysphasia: Secondary | ICD-10-CM

## 2018-09-08 MED ORDER — PANTOPRAZOLE SODIUM 40 MG PO TBEC: 40.0000 mg | DELAYED_RELEASE_TABLET | Freq: Every day | ORAL | 6 refills | Status: DC

## 2018-09-08 MED ORDER — PANTOPRAZOLE SODIUM 40 MG PO TBEC: 40.0000 mg | DELAYED_RELEASE_TABLET | Freq: Two times a day (BID) | ORAL | 6 refills | Status: DC

## 2018-09-08 NOTE — Progress Notes (Signed)
Primary Care Physician: Marinda Elk, MD  Primary Gastroenterologist:  Dr. Lucilla Lame  No chief complaint on file.   HPI: Charlotte Conrad is a 53 y.o. female here follow-up of her heartburn.  The patient was last seen 2 years ago for refill of her medication.  There is no report of any nausea vomiting fevers chills black stools or bloody stools.    There is no report of any unexplained weight loss.   The patient reports that she has been having much more frequent acid breakthrough a few times a week and now for the last 2 or 3 days she has been having dysphasia to both pills and solids.  The patient denies any food getting stuck so severely that she is not able to get it down.   Current Outpatient Medications  Medication Sig Dispense Refill   CALCIUM PO Take by mouth daily.     Cranberry 500 MG CAPS Take by mouth.     DEXILANT 60 MG capsule TAKE ONE CAPSULE BY MOUTH ONE TIME DAILY  30 capsule 3   DOCUSATE SODIUM PO Take by mouth daily as needed.     fluticasone (FLONASE) 50 MCG/ACT nasal spray Place into the nose.     loratadine (CLARITIN) 10 MG tablet Take 10 mg by mouth.     Multiple Vitamin (MULTI-VITAMINS) TABS Take by mouth.     norethindrone-ethinyl estradiol (JUNEL FE,GILDESS FE,LOESTRIN FE) 1-20 MG-MCG tablet Take 1 tablet by mouth daily.     triamcinolone cream (KENALOG) 0.1 % Apply topically.     vitamin C (ASCORBIC ACID) 500 MG tablet Take 500 mg by mouth.     No current facility-administered medications for this visit.     Allergies as of 09/08/2018 - Review Complete 09/08/2017  Allergen Reaction Noted   Esomeprazole magnesium Other (See Comments) 08/08/2013   Etodolac Other (See Comments) 04/01/2013   Levofloxacin Other (See Comments) 05/27/2012   Sulfa antibiotics Other (See Comments) 05/30/2012    ROS:  General: Negative for anorexia, weight loss, fever, chills, fatigue, weakness. ENT: Negative for hoarseness, difficulty swallowing ,  nasal congestion. CV: Negative for chest pain, angina, palpitations, dyspnea on exertion, peripheral edema.  Respiratory: Negative for dyspnea at rest, dyspnea on exertion, cough, sputum, wheezing.  GI: See history of present illness. GU:  Negative for dysuria, hematuria, urinary incontinence, urinary frequency, nocturnal urination.  Endo: Negative for unusual weight change.    Physical Examination:   There were no vitals taken for this visit.  General: Well-nourished, well-developed in no acute distress.  Eyes: No icterus. Conjunctivae pink. Mouth: Oropharyngeal mucosa moist and pink , no lesions erythema or exudate. Lungs: Clear to auscultation bilaterally. Non-labored. Heart: Regular rate and rhythm, no murmurs rubs or gallops.  Abdomen: Bowel sounds are normal, nontender, nondistended, no hepatosplenomegaly or masses, no abdominal bruits or hernia , no rebound or guarding.   Extremities: No lower extremity edema. No clubbing or deformities. Neuro: Alert and oriented x 3.  Grossly intact. Skin: Warm and dry, no jaundice.   Psych: Alert and cooperative, normal mood and affect.  Labs:    Imaging Studies: No results found.  Assessment and Plan:   [name] is a 53 y.o. y/o female who comes in today with a report of the Peggs not working for her anymore with lots of acid breakthrough.  The patient will be started on Protonix twice a day and she will give it a few weeks to see if it improves  her swallowing.  The patient has been told that if her swallowing gets worse or does not improve in the next 2 to 3 weeks she should call my office and be set up for an upper endoscopy with possible dilation.  The patient has been explained the plan and agrees with it.    Lucilla Lame, MD. Marval Regal   Note: This dictation was prepared with Dragon dictation along with smaller phrase technology. Any transcriptional errors that result from this process are unintentional.

## 2018-11-21 DIAGNOSIS — M810 Age-related osteoporosis without current pathological fracture: Secondary | ICD-10-CM | POA: Insufficient documentation

## 2018-11-29 ENCOUNTER — Other Ambulatory Visit: Payer: Self-pay | Admitting: Gastroenterology

## 2019-06-29 ENCOUNTER — Other Ambulatory Visit: Payer: Self-pay

## 2019-06-29 MED ORDER — DEXILANT 60 MG PO CPDR: 1.0000 | DELAYED_RELEASE_CAPSULE | Freq: Every day | ORAL | 6 refills | Status: DC

## 2019-06-30 ENCOUNTER — Other Ambulatory Visit: Payer: Self-pay | Admitting: Gastroenterology

## 2019-09-11 ENCOUNTER — Ambulatory Visit: Payer: BC Managed Care – PPO | Admitting: Gastroenterology

## 2019-11-08 ENCOUNTER — Other Ambulatory Visit: Payer: Self-pay

## 2019-11-08 ENCOUNTER — Encounter: Payer: Self-pay | Admitting: Gastroenterology

## 2019-11-08 ENCOUNTER — Ambulatory Visit: Payer: BC Managed Care – PPO | Admitting: Gastroenterology

## 2019-11-08 VITALS — BP 122/74 | HR 80 | Ht 63.0 in | Wt 129.0 lb

## 2019-11-08 DIAGNOSIS — K219 Gastro-esophageal reflux disease without esophagitis: Secondary | ICD-10-CM | POA: Diagnosis not present

## 2019-11-08 MED ORDER — DEXILANT 60 MG PO CPDR: 1.0000 | DELAYED_RELEASE_CAPSULE | Freq: Every day | ORAL | 11 refills | Status: DC

## 2019-11-08 NOTE — Progress Notes (Signed)
Primary Care Physician: Marinda Elk, MD  Primary Gastroenterologist:  Dr. Lucilla Lame  Chief Complaint  Patient presents with   Medication Refill   Gastroesophageal Reflux    HPI: Charlotte Conrad is a 54 y.o. female here with a history of reflux.  The patient had an upper endoscopy with dilation of the esophagus for dysphagia back in 2018.  The patient was seen by me last year for continued acid reflux with breakthrough.  The patient was on Dexilant and stated that the medication was working for her any longer.  The patient was started on Protonix twice a day.  At that time the patient was told that if her swallowing did not improve that she would call back and be set up for an EGD. He switched back to [place] and says that her symptoms are better. The patient denies any dysphagia fevers chills nausea vomiting black stools or bloody stools.  She states that she is only here for a refill of her medication.  Past Medical History:  Diagnosis Date   Anemia 05/23/2013   Eczema 10/24/2014   Elevated TSH 10/24/2014   GERD (gastroesophageal reflux disease)    Incomplete emptying of bladder 05/31/2012   Nocturia 08/08/2013    Current Outpatient Medications  Medication Sig Dispense Refill   CALCIUM PO Take by mouth daily.     Cranberry 500 MG CAPS Take by mouth.     dexlansoprazole (DEXILANT) 60 MG capsule Take 1 capsule (60 mg total) by mouth daily. **PLEASE CALL AND SCHEDULE ONE YEAR FOLLOW UP IN AUGUST** 30 capsule 6   DOCUSATE SODIUM PO Take by mouth daily as needed.     fluticasone (FLONASE) 50 MCG/ACT nasal spray Place into the nose.     loratadine (CLARITIN) 10 MG tablet Take 10 mg by mouth.     Multiple Vitamin (MULTI-VITAMINS) TABS Take by mouth.     norethindrone-ethinyl estradiol (FEMHRT LOW DOSE) 0.5-2.5 MG-MCG tablet      norethindrone-ethinyl estradiol (JUNEL FE,GILDESS FE,LOESTRIN FE) 1-20 MG-MCG tablet Take 1 tablet by mouth daily.      pantoprazole (PROTONIX) 40 MG tablet Take 1 tablet (40 mg total) by mouth 2 (two) times daily. 60 tablet 6   triamcinolone cream (KENALOG) 0.1 % Apply topically.     vitamin C (ASCORBIC ACID) 500 MG tablet Take 500 mg by mouth.     XIIDRA 5 % SOLN      No current facility-administered medications for this visit.    Allergies as of 11/08/2019 - Review Complete 09/08/2018  Allergen Reaction Noted   Esomeprazole magnesium Other (See Comments) 08/08/2013   Etodolac Other (See Comments) 04/01/2013   Levofloxacin Other (See Comments) 05/27/2012   Sulfa antibiotics Other (See Comments) 05/30/2012    ROS:  General: Negative for anorexia, weight loss, fever, chills, fatigue, weakness. ENT: Negative for hoarseness, difficulty swallowing , nasal congestion. CV: Negative for chest pain, angina, palpitations, dyspnea on exertion, peripheral edema.  Respiratory: Negative for dyspnea at rest, dyspnea on exertion, cough, sputum, wheezing.  GI: See history of present illness. GU:  Negative for dysuria, hematuria, urinary incontinence, urinary frequency, nocturnal urination.  Endo: Negative for unusual weight change.    Physical Examination:   BP 122/74    Pulse 80    Ht 5\' 3"  (1.6 m)    Wt 129 lb (58.5 kg)    BMI 22.85 kg/m   General: Well-nourished, well-developed in no acute distress.  Eyes: No icterus. Conjunctivae pink. Lungs: Clear to  auscultation bilaterally. Non-labored. Heart: Regular rate and rhythm, no murmurs rubs or gallops.  Abdomen: Bowel sounds are normal, nontender, nondistended, no hepatosplenomegaly or masses, no abdominal bruits or hernia , no rebound or guarding.   Extremities: No lower extremity edema. No clubbing or deformities. Neuro: Alert and oriented x 3.  Grossly intact. Skin: Warm and dry, no jaundice.   Psych: Alert and cooperative, normal mood and affect.  Labs:    Imaging Studies: No results found.  Assessment and Plan:   Charlotte Conrad is a 54  y.o. y/o female comes in today with a history of dysphagia and esophageal dilation.  The patient not have any symptoms at the present time and states that she is feeling well.  The patient will have her Dexilant refilled.  The patient will contact me if any of her symptoms worsen.  The patient has also been informed that her PPI can decrease calcium 0 patient states she already has a diagnosis of osteoporosis.  Patient has been explained the plan agrees with it.     Lucilla Lame, MD. Marval Regal    Note: This dictation was prepared with Dragon dictation along with smaller phrase technology. Any transcriptional errors that result from this process are unintentional.

## 2020-07-22 ENCOUNTER — Encounter: Payer: Self-pay | Admitting: Gastroenterology

## 2020-07-22 ENCOUNTER — Ambulatory Visit (INDEPENDENT_AMBULATORY_CARE_PROVIDER_SITE_OTHER): Payer: BC Managed Care – PPO | Admitting: Gastroenterology

## 2020-07-22 ENCOUNTER — Other Ambulatory Visit: Payer: Self-pay

## 2020-07-22 VITALS — BP 135/86 | HR 89 | Temp 98.2°F | Ht 63.0 in | Wt 134.0 lb

## 2020-07-22 DIAGNOSIS — K219 Gastro-esophageal reflux disease without esophagitis: Secondary | ICD-10-CM | POA: Diagnosis not present

## 2020-07-22 NOTE — Progress Notes (Signed)
Primary Care Physician: Marinda Elk, MD  Primary Gastroenterologist:  Dr. Lucilla Lame  Chief Complaint  Patient presents with   Gastroesophageal Reflux    Medication refill    HPI: Charlotte Conrad is a 55 y.o. female here for follow-up of reflux.  The patient was seen by her primary care provider in May and reported to her that her Dexilant was working well for her without any issues.  The patient has a history of having esophageal stricture in 2018 with dilation.  The patient also has a history of acid breakthrough prior to be put on Dexilant. At the patient's last office visit with her primary care provider she was recommended to follow up with GI. The patient reports that Dexilant will no longer be covered by her insurance company.  The patient states she has approximately 53-month supply.  She was also given a list of medication she can take of which she is unable to take most of them except lansoprazole.  The patient states that she is being well controlled by her medication at the present time.  Past Medical History:  Diagnosis Date   Anemia 05/23/2013   Eczema 10/24/2014   Elevated TSH 10/24/2014   GERD (gastroesophageal reflux disease)    Incomplete emptying of bladder 05/31/2012   Nocturia 08/08/2013    Current Outpatient Medications  Medication Sig Dispense Refill   CALCIUM PO Take by mouth daily.     Cranberry 500 MG CAPS Take by mouth.     dexlansoprazole (DEXILANT) 60 MG capsule Take 1 capsule (60 mg total) by mouth daily. 30 capsule 11   DOCUSATE SODIUM PO Take by mouth daily as needed.     fluticasone (FLONASE) 50 MCG/ACT nasal spray Place into the nose.     loratadine (CLARITIN) 10 MG tablet Take 10 mg by mouth.     Multiple Vitamin (MULTI-VITAMINS) TABS Take by mouth.     norethindrone-ethinyl estradiol (FEMHRT LOW DOSE) 0.5-2.5 MG-MCG tablet      norethindrone-ethinyl estradiol (JUNEL FE,GILDESS FE,LOESTRIN FE) 1-20 MG-MCG tablet Take 1 tablet by mouth  daily.     pantoprazole (PROTONIX) 40 MG tablet Take 1 tablet (40 mg total) by mouth 2 (two) times daily. 60 tablet 6   triamcinolone cream (KENALOG) 0.1 % Apply topically.     vitamin C (ASCORBIC ACID) 500 MG tablet Take 500 mg by mouth.     XIIDRA 5 % SOLN      No current facility-administered medications for this visit.    Allergies as of 07/22/2020 - Review Complete 07/22/2020  Allergen Reaction Noted   Esomeprazole magnesium Other (See Comments) 08/08/2013   Etodolac Other (See Comments) 04/01/2013   Levofloxacin Other (See Comments) 05/27/2012   Sulfa antibiotics Other (See Comments) 05/30/2012    ROS:  General: Negative for anorexia, weight loss, fever, chills, fatigue, weakness. ENT: Negative for hoarseness, difficulty swallowing , nasal congestion. CV: Negative for chest pain, angina, palpitations, dyspnea on exertion, peripheral edema.  Respiratory: Negative for dyspnea at rest, dyspnea on exertion, cough, sputum, wheezing.  GI: See history of present illness. GU:  Negative for dysuria, hematuria, urinary incontinence, urinary frequency, nocturnal urination.  Endo: Negative for unusual weight change.    Physical Examination:   BP 135/86 (BP Location: Right Arm, Patient Position: Sitting, Cuff Size: Normal)   Pulse 89   Temp 98.2 F (36.8 C) (Temporal)   Ht 5\' 3"  (1.6 m)   Wt 134 lb (60.8 kg)   BMI 23.74 kg/m  General: Well-nourished, well-developed in no acute distress.  Eyes: No icterus. Conjunctivae pink. Lungs: Clear to auscultation bilaterally. Non-labored. Heart: Regular rate and rhythm, no murmurs rubs or gallops.  Abdomen: Bowel sounds are normal, nontender, nondistended, no hepatosplenomegaly or masses, no abdominal bruits or hernia , no rebound or guarding.   Extremities: No lower extremity edema. No clubbing or deformities. Neuro: Alert and oriented x 3.  Grossly intact. Skin: Warm and dry, no jaundice.   Psych: Alert and cooperative, normal mood and  affect.  Labs:    Imaging Studies: No results found.  Assessment and Plan:   Charlotte Conrad is a 55 y.o. y/o female who comes in for follow-up.  The patient has been doing well on her Dexilant but is concerned that the Dexilant will no longer be covered by her insurance company.  The patient has been told that we can try to do a prior authorization and if that does not work then the patient can be tried on lansoprazole.  The patient has been explained the plan agrees with it.     Lucilla Lame, MD. Marval Regal    Note: This dictation was prepared with Dragon dictation along with smaller phrase technology. Any transcriptional errors that result from this process are unintentional.

## 2020-09-09 ENCOUNTER — Telehealth: Payer: Self-pay | Admitting: Gastroenterology

## 2020-09-11 NOTE — Telephone Encounter (Signed)
Spoke with pt regarding prior authorization. Will start the process and will let her know once its approved or denied.

## 2020-09-23 ENCOUNTER — Other Ambulatory Visit: Payer: Self-pay

## 2020-09-23 ENCOUNTER — Telehealth: Payer: Self-pay | Admitting: Gastroenterology

## 2020-09-23 MED ORDER — LANSOPRAZOLE 30 MG PO CPDR: 30.0000 mg | DELAYED_RELEASE_CAPSULE | Freq: Every day | ORAL | 6 refills | Status: DC

## 2020-09-23 NOTE — Telephone Encounter (Signed)
Pt. Calling because she says her insurance will not cover her medicine. She is requesting to be placed on another medicine that her insurance will cover.

## 2020-09-23 NOTE — Telephone Encounter (Signed)
Pt has been switched to Lansoprazole. Rx sent to pt's pharmacy.

## 2021-04-15 ENCOUNTER — Other Ambulatory Visit: Payer: Self-pay | Admitting: Gastroenterology

## 2021-07-08 ENCOUNTER — Other Ambulatory Visit: Payer: Self-pay | Admitting: Gastroenterology

## 2021-08-25 ENCOUNTER — Ambulatory Visit: Payer: BC Managed Care – PPO | Admitting: Gastroenterology

## 2021-08-25 ENCOUNTER — Encounter: Payer: Self-pay | Admitting: Gastroenterology

## 2021-08-25 VITALS — BP 117/78 | HR 92 | Temp 98.1°F | Wt 141.0 lb

## 2021-08-25 DIAGNOSIS — K219 Gastro-esophageal reflux disease without esophagitis: Secondary | ICD-10-CM | POA: Diagnosis not present

## 2021-08-25 MED ORDER — LANSOPRAZOLE 30 MG PO CPDR: 30.0000 mg | DELAYED_RELEASE_CAPSULE | Freq: Every day | ORAL | 2 refills | Status: DC

## 2021-08-25 NOTE — Progress Notes (Signed)
Primary Care Physician: Marinda Elk, MD  Primary Gastroenterologist:  Dr. Lucilla Lame  Chief Complaint  Patient presents with   Gastroesophageal Reflux    Pt denies any new concerns, and doing well on Prevacid   Medication Refill    HPI: Charlotte Conrad is a 56 y.o. female here for follow-up with a history of GERD and now on pantoprazole.  The patient denies any new symptoms.  She also reports that she is not having any fevers chills nausea vomiting black stools or bloody stools.  She is also not having dysphagia.  Past Medical History:  Diagnosis Date   Anemia 05/23/2013   Eczema 10/24/2014   Elevated TSH 10/24/2014   GERD (gastroesophageal reflux disease)    Incomplete emptying of bladder 05/31/2012   Nocturia 08/08/2013    Current Outpatient Medications  Medication Sig Dispense Refill   CALCIUM PO Take by mouth daily.     clotrimazole-betamethasone (LOTRISONE) cream Apply topically 2 (two) times daily.     Cranberry 500 MG CAPS Take by mouth.     DOCUSATE SODIUM PO Take by mouth daily as needed.     fluticasone (FLONASE) 50 MCG/ACT nasal spray Place into the nose.     lansoprazole (PREVACID) 30 MG capsule TAKE ONE CAPSULE BY MOUTH DAILY 30 capsule 1   loratadine (CLARITIN) 10 MG tablet Take 10 mg by mouth.     Multiple Vitamin (MULTI-VITAMINS) TABS Take by mouth.     norethindrone-ethinyl estradiol (FEMHRT 1/5) 1-5 MG-MCG TABS tablet Take 1 tablet by mouth daily.     triamcinolone cream (KENALOG) 0.1 % Apply topically.     No current facility-administered medications for this visit.    Allergies as of 08/25/2021 - Review Complete 08/25/2021  Allergen Reaction Noted   Esomeprazole magnesium Other (See Comments) 08/08/2013   Etodolac Other (See Comments) 04/01/2013   Levofloxacin Other (See Comments) 05/27/2012   Sulfa antibiotics Other (See Comments) 05/30/2012    ROS:  General: Negative for anorexia, weight loss, fever, chills, fatigue, weakness. ENT:  Negative for hoarseness, difficulty swallowing , nasal congestion. CV: Negative for chest pain, angina, palpitations, dyspnea on exertion, peripheral edema.  Respiratory: Negative for dyspnea at rest, dyspnea on exertion, cough, sputum, wheezing.  GI: See history of present illness. GU:  Negative for dysuria, hematuria, urinary incontinence, urinary frequency, nocturnal urination.  Endo: Negative for unusual weight change.    Physical Examination:   BP 117/78   Pulse 92   Temp 98.1 F (36.7 C) (Oral)   Wt 141 lb (64 kg)   BMI 24.98 kg/m   General: Well-nourished, well-developed in no acute distress.  Eyes: No icterus. Conjunctivae pink. Neuro: Alert and oriented x 3.  Grossly intact. Skin: Warm and dry, no jaundice.   Psych: Alert and cooperative, normal mood and affect.  Labs:    Imaging Studies: No results found.  Assessment and Plan:   [name] is a 56 y.o. y/o female who comes in for refill of her medications due to it being over a year.  The patient will have her Prevacid refilled.  The patient is not having any worry symptoms.  The patient has been told the risks and benefits of PPIs.  She agrees to stay on it.  Patient will follow-up as needed.     Lucilla Lame, MD. Marval Regal    Note: This dictation was prepared with Dragon dictation along with smaller phrase technology. Any transcriptional errors that result from this process are unintentional.

## 2021-09-02 ENCOUNTER — Ambulatory Visit: Payer: BC Managed Care – PPO | Admitting: Gastroenterology

## 2022-04-17 ENCOUNTER — Ambulatory Visit: Payer: BC Managed Care – PPO | Admitting: Urology

## 2022-04-17 ENCOUNTER — Encounter: Payer: Self-pay | Admitting: Urology

## 2022-04-17 VITALS — BP 148/85 | HR 90 | Ht 64.0 in | Wt 141.0 lb

## 2022-04-17 DIAGNOSIS — R399 Unspecified symptoms and signs involving the genitourinary system: Secondary | ICD-10-CM | POA: Diagnosis not present

## 2022-04-17 DIAGNOSIS — R300 Dysuria: Secondary | ICD-10-CM

## 2022-04-17 DIAGNOSIS — R3911 Hesitancy of micturition: Secondary | ICD-10-CM | POA: Diagnosis not present

## 2022-04-17 LAB — MICROSCOPIC EXAMINATION

## 2022-04-17 LAB — URINALYSIS, COMPLETE
Bilirubin, UA: NEGATIVE
Glucose, UA: NEGATIVE
Ketones, UA: NEGATIVE
Leukocytes,UA: NEGATIVE
Nitrite, UA: NEGATIVE
Protein,UA: NEGATIVE
RBC, UA: NEGATIVE
Specific Gravity, UA: 1.015 (ref 1.005–1.030)
Urobilinogen, Ur: 0.2 mg/dL (ref 0.2–1.0)
pH, UA: 7.5 (ref 5.0–7.5)

## 2022-04-17 LAB — BLADDER SCAN AMB NON-IMAGING

## 2022-04-17 MED ORDER — TAMSULOSIN HCL 0.4 MG PO CAPS: 0.4000 mg | ORAL_CAPSULE | Freq: Every day | ORAL | 0 refills | Status: DC

## 2022-04-17 NOTE — Progress Notes (Signed)
I, DeAsia L Maxie,acting as a scribe for Riki Altes, MD.,have documented all relevant documentation on the behalf of Riki Altes, MD,as directed by  Riki Altes, MD while in the presence of Riki Altes, MD.   04/17/22 10:51 AM   Khaliya Erby Pian 04-Jan-1966 [id number]  Referring provider: Patrice Paradise, MD [address] [facility] [place]  [place] [address]  Chief Complaint  Patient presents with   New Patient (Initial Visit)   Dysuria    HPI: Charlotte Conrad is a 57 y.o. female who is self-referred for difficulty emptying bladder.  She describes difficulty in initiating a urinary stream before going to bed and upon waking during the night. To facilitate urination, she often has to adopt a squatting and leaning forward position. While she consumes a significant amount of water during the day and experiences some degree of urinary retention, the symptoms are predominantly nocturnal. She has undergone two cystoscopies and a urethral dilation procedure in the past, which was indicated for similar symptoms, though previously the symptoms were more constant throughout the day   PMH: Past Medical History:  Diagnosis Date   Anemia 05/23/2013   Eczema 10/24/2014   Elevated TSH 10/24/2014   GERD (gastroesophageal reflux disease)    Incomplete emptying of bladder 05/31/2012   Nocturia 08/08/2013    Surgical History: Past Surgical History:  Procedure Laterality Date   ADENOIDECTOMY     DILATION AND CURETTAGE, DIAGNOSTIC / THERAPEUTIC     ESOPHAGEAL DILATION N/A 08/27/2016   Procedure: ESOPHAGEAL DILATION;  Surgeon: Midge Minium, MD;  Location: [facility];  Service: Gastroenterology;  Laterality: N/A;   ESOPHAGOGASTRODUODENOSCOPY (EGD) WITH PROPOFOL N/A 08/27/2016   Procedure: ESOPHAGOGASTRODUODENOSCOPY (EGD) WITH PROPOFOL;  Surgeon: Midge Minium, MD;  Location: [facility];  Service: Gastroenterology;  Laterality: N/A;   UPPER GI  ENDOSCOPY     02/01/2003, 02/19//2013, 12/02/2011    Home Medications:  Allergies as of 04/17/2022       Reactions   Esomeprazole Magnesium Other (See Comments)   cystitis   Etodolac Other (See Comments)   Flushed, bp changes   Levofloxacin Other (See Comments)   Agitation, insomnia   Sulfa Antibiotics Other (See Comments)   Urinary retention        Medication List        Accurate as of April 17, 2022 10:51 AM. If you have any questions, ask your nurse or doctor.          STOP taking these medications    CALCIUM PO Stopped by: Riki Altes, MD   DOCUSATE SODIUM PO Stopped by: Riki Altes, MD       TAKE these medications    Calcium High Potency/Vitamin D 600-5 MG-MCG Tabs Generic drug: Calcium Carb-Cholecalciferol Take by mouth.   clotrimazole-betamethasone cream Commonly known as: LOTRISONE Apply topically 2 (two) times daily.   Cranberry 500 MG Caps Take by mouth.   fluticasone 50 MCG/ACT nasal spray Commonly known as: FLONASE Place into the nose.   lansoprazole 30 MG capsule Commonly known as: PREVACID Take 1 capsule (30 mg total) by mouth daily.   loratadine 10 MG tablet Commonly known as: CLARITIN Take 10 mg by mouth.   Multi-Vitamins Tabs Take by mouth.   norethindrone-ethinyl estradiol 1-5 MG-MCG Tabs tablet Commonly known as: FEMHRT 1/5 Take 1 tablet by mouth daily.   tamsulosin 0.4 MG Caps capsule Commonly known as: FLOMAX Take 1 capsule (0.4 mg total) by mouth  daily.   triamcinolone cream 0.1 % Commonly known as: KENALOG Apply topically.        Allergies:  Allergies  Allergen Reactions   Esomeprazole Magnesium Other (See Comments)    cystitis   Etodolac Other (See Comments)    Flushed, bp changes   Levofloxacin Other (See Comments)    Agitation, insomnia   Sulfa Antibiotics Other (See Comments)    Urinary retention    Family History: Family History  Problem Relation Age of Onset   Hypertension Mother     Cancer Mother    Diabetes Mellitus II Father    Melanoma Father    Diabetes Sister    Hypertension Sister    Stroke Maternal Grandfather     Social History:  reports that she has never smoked. She has never been exposed to tobacco smoke. She has never used smokeless tobacco. She reports that she does not drink alcohol and does not use drugs.   Physical Exam: BP (!) 148/85   Pulse 90   Ht 5\' 4"  (1.626 m)   Wt 141 lb (64 kg)   BMI 24.20 kg/m   Constitutional:  Alert and oriented, No acute distress. HEENT: [place] AT, moist mucus membranes.  Trachea midline, no masses. Cardiovascular: No clubbing, cyanosis, or edema. Respiratory: Normal respiratory effort, no increased work of breathing. GI: Abdomen is soft, nontender, nondistended, no abdominal masses Pelvic exam: urethra normal in appearance atrophic vaginal mucosa no cystocele. Skin: No rashes, bruises or suspicious lesions. Neurologic: Grossly intact, no focal deficits, moving all 4 extremities. Psychiatric: Normal mood and affect.   Assessment & Plan:    Obstructive voiding symptoms PVR 0 mL Symptoms only present at night Trial Tamsulosin 0.4 mg daily Follow-up office visit in one month for symptom recheck If still symptomatic at that visit will perform cystoscopy   [facility] [address], [address] [place], [place] [address] [phone]

## 2022-04-17 NOTE — Patient Instructions (Signed)

## 2022-05-18 ENCOUNTER — Other Ambulatory Visit: Payer: Self-pay | Admitting: Gastroenterology

## 2022-05-22 ENCOUNTER — Other Ambulatory Visit: Payer: Self-pay | Admitting: Urology

## 2022-05-22 ENCOUNTER — Ambulatory Visit: Payer: BC Managed Care – PPO | Admitting: Urology

## 2022-05-22 ENCOUNTER — Encounter: Payer: Self-pay | Admitting: Urology

## 2022-05-22 VITALS — BP 137/86 | HR 81 | Ht 64.0 in | Wt 141.0 lb

## 2022-05-22 DIAGNOSIS — N138 Other obstructive and reflux uropathy: Secondary | ICD-10-CM | POA: Diagnosis not present

## 2022-05-22 DIAGNOSIS — R3911 Hesitancy of micturition: Secondary | ICD-10-CM

## 2022-05-22 DIAGNOSIS — N369 Urethral disorder, unspecified: Secondary | ICD-10-CM | POA: Diagnosis not present

## 2022-05-22 LAB — URINALYSIS, COMPLETE
Bilirubin, UA: NEGATIVE
Glucose, UA: NEGATIVE
Ketones, UA: NEGATIVE
Leukocytes,UA: NEGATIVE
Nitrite, UA: NEGATIVE
Protein,UA: NEGATIVE
Specific Gravity, UA: <1.005 — ABNORMAL LOW (ref 1.005–1.030)
Urobilinogen, Ur: 0.2 mg/dL (ref 0.2–1.0)
pH, UA: 6.5 (ref 5.0–7.5)

## 2022-05-22 LAB — MICROSCOPIC EXAMINATION

## 2022-05-22 NOTE — Progress Notes (Unsigned)
Surgical Physician Order Form [facility]  * Scheduling expectation :  Patient preference  *Length of Case: 30 minutes  *Clearance needed: no  *Anticoagulation Instructions: N/A  *Aspirin Instructions: N/A  *Post-op visit Date/Instructions:  1 month follow up  *Diagnosis: Urinary hesitancy  *Procedure:  Cysto w/urethral dilation (10272)   Additional orders: N/A  -Admit type: OUTpatient  -Anesthesia: MAC  -VTE Prophylaxis Standing Order SCD's       Other:   -Standing Lab Orders Per Anesthesia    Lab other: None  -Standing Test orders EKG/Chest x-ray per Anesthesia       Test other:   - Medications:  Ancef 2gm IV  -Other orders:  N/A

## 2022-05-22 NOTE — H&P (View-Only) (Signed)
 05/22/2022 10:38 AM   Jilene C Hunnell 02/27/1965 [id number]  Referring provider: McLaughlin, Miriam K, MD [address] [facility] [place],  [place] [address]  Chief Complaint  Patient presents with   Cysto    HPI: 57 y.o. female presents today for cystoscopy.  She describes difficulty in initiating a urinary stream before going to bed and upon waking during the night. To facilitate urination, she often has to adopt a squatting and leaning forward position. While she consumes a significant amount of water during the day and experiences some degree of urinary retention, the symptoms are predominantly nocturnal. She has undergone two cystoscopies and a urethral dilation procedure in the past, which was indicated for similar symptoms, though previously the symptoms were more constant throughout the day At visit 04/17/22 she was given a trial of tamsulosin which did not help her symptoms Cystoscopy they attempted however unable to advance the flexible cystoscope per urethra   PMH: Past Medical History:  Diagnosis Date   Anemia 05/23/2013   Eczema 10/24/2014   Elevated TSH 10/24/2014   GERD (gastroesophageal reflux disease)    Incomplete emptying of bladder 05/31/2012   Nocturia 08/08/2013    Surgical History: Past Surgical History:  Procedure Laterality Date   ADENOIDECTOMY     DILATION AND CURETTAGE, DIAGNOSTIC / THERAPEUTIC     ESOPHAGEAL DILATION N/A 08/27/2016   Procedure: ESOPHAGEAL DILATION;  Surgeon: Wohl, Darren, MD;  Location: [facility];  Service: Gastroenterology;  Laterality: N/A;   ESOPHAGOGASTRODUODENOSCOPY (EGD) WITH PROPOFOL N/A 08/27/2016   Procedure: ESOPHAGOGASTRODUODENOSCOPY (EGD) WITH PROPOFOL;  Surgeon: Wohl, Darren, MD;  Location: [facility];  Service: Gastroenterology;  Laterality: N/A;   UPPER GI ENDOSCOPY     02/01/2003, 02/19//2013, 12/02/2011    Home Medications:  Allergies as of 05/22/2022       Reactions    Esomeprazole Magnesium Other (See Comments)   cystitis   Etodolac Other (See Comments)   Flushed, bp changes   Levofloxacin Other (See Comments)   Agitation, insomnia   Sulfa Antibiotics Other (See Comments)   Urinary retention        Medication List        Accurate as of May 22, 2022 10:38 AM. If you have any questions, ask your nurse or doctor.          STOP taking these medications    clotrimazole-betamethasone cream Commonly known as: LOTRISONE Stopped by: Yvan Dority C Ellamay Fors, MD   fluticasone 50 MCG/ACT nasal spray Commonly known as: FLONASE Stopped by: Breklyn Fabrizio C Jniya Madara, MD       TAKE these medications    Calcium High Potency/Vitamin D 600-5 MG-MCG Tabs Generic drug: Calcium Carb-Cholecalciferol Take by mouth.   Cranberry 500 MG Caps Take by mouth.   Krill Oil 1000 MG Caps Take by mouth.   lansoprazole 30 MG capsule Commonly known as: PREVACID Take 1 capsule (30 mg total) by mouth daily. OFFICE VISIT REQUIRED   loratadine 10 MG tablet Commonly known as: CLARITIN Take 10 mg by mouth.   Multi-Vitamins Tabs Take by mouth.   norethindrone-ethinyl estradiol 1-5 MG-MCG Tabs tablet Commonly known as: FEMHRT 1/5 Take 1 tablet by mouth daily.   tamsulosin 0.4 MG Caps capsule Commonly known as: FLOMAX Take 1 capsule (0.4 mg total) by mouth daily.   triamcinolone cream 0.1 % Commonly known as: KENALOG Apply topically.        Allergies:  Allergies  Allergen Reactions   Esomeprazole Magnesium Other (See Comments)      cystitis   Etodolac Other (See Comments)    Flushed, bp changes   Levofloxacin Other (See Comments)    Agitation, insomnia   Sulfa Antibiotics Other (See Comments)    Urinary retention    Family History: Family History  Problem Relation Age of Onset   Hypertension Mother    Cancer Mother    Diabetes Mellitus II Father    Melanoma Father    Diabetes Sister    Hypertension Sister    Stroke Maternal Grandfather     Social  History:  reports that she has never smoked. She has never been exposed to tobacco smoke. She has never used smokeless tobacco. She reports that she does not drink alcohol and does not use drugs.   Physical Exam: BP 137/86   Pulse 81   Ht 5' 4" (1.626 m)   Wt 141 lb (64 kg)   BMI 24.20 kg/m   Constitutional:  Alert and oriented, No acute distress. HEENT: [place] AT Respiratory: Normal respiratory effort, no increased work of breathing. Skin: No rashes, bruises or suspicious lesions. Neurologic: Grossly intact, no focal deficits, moving all 4 extremities. Psychiatric: Normal mood and affect.   Assessment & Plan:    Obstructive voiding symptoms Unable to perform cystoscopy due to urethral narrowing Previously required dilation and was symptom-free for several years until recently Offered dilation in office versus same-day surgery under sedation and she elected the latter   Kenyah Luba C Khyleigh Furney, MD  [facility] [address], [address] [place],  [address] [phone]  

## 2022-05-22 NOTE — Progress Notes (Signed)
05/22/2022 10:38 AM   Charlotte Conrad [birth year] [id number]  Referring provider: Patrice Paradise, MD [address] [facility] [address] [facility] [place],  [place] [address]  Chief Complaint  Patient presents with   Cysto    HPI: 57 y.o. female presents today for cystoscopy.  She describes difficulty in initiating a urinary stream before going to bed and upon waking during the night. To facilitate urination, she often has to adopt a squatting and leaning forward position. While she consumes a significant amount of water during the day and experiences some degree of urinary retention, the symptoms are predominantly nocturnal. She has undergone two cystoscopies and a urethral dilation procedure in the past, which was indicated for similar symptoms, though previously the symptoms were more constant throughout the day At visit 04/17/22 she was given a trial of tamsulosin which did not help her symptoms Cystoscopy they attempted however unable to advance the flexible cystoscope per urethra   PMH: Past Medical History:  Diagnosis Date   Anemia 05/23/2013   Eczema 10/24/2014   Elevated TSH 10/24/2014   GERD (gastroesophageal reflux disease)    Incomplete emptying of bladder 05/31/2012   Nocturia 08/08/2013    Surgical History: Past Surgical History:  Procedure Laterality Date   ADENOIDECTOMY     DILATION AND CURETTAGE, DIAGNOSTIC / THERAPEUTIC     ESOPHAGEAL DILATION N/A 08/27/2016   Procedure: ESOPHAGEAL DILATION;  Surgeon: Midge Minium, MD;  Location: [facility];  Service: Gastroenterology;  Laterality: N/A;   ESOPHAGOGASTRODUODENOSCOPY (EGD) WITH PROPOFOL N/A 08/27/2016   Procedure: ESOPHAGOGASTRODUODENOSCOPY (EGD) WITH PROPOFOL;  Surgeon: Midge Minium, MD;  Location: [facility];  Service: Gastroenterology;  Laterality: N/A;   UPPER GI ENDOSCOPY     02/01/2003, 02/19//2013, 12/02/2011    Home Medications:  Allergies as of 05/22/2022       Reactions    Esomeprazole Magnesium Other (See Comments)   cystitis   Etodolac Other (See Comments)   Flushed, bp changes   Levofloxacin Other (See Comments)   Agitation, insomnia   Sulfa Antibiotics Other (See Comments)   Urinary retention        Medication List        Accurate as of May 22, 2022 10:38 AM. If you have any questions, ask your nurse or doctor.          STOP taking these medications    clotrimazole-betamethasone cream Commonly known as: LOTRISONE Stopped by: Riki Altes, MD   fluticasone 50 MCG/ACT nasal spray Commonly known as: FLONASE Stopped by: Riki Altes, MD       TAKE these medications    Calcium High Potency/Vitamin D 600-5 MG-MCG Tabs Generic drug: Calcium Carb-Cholecalciferol Take by mouth.   Cranberry 500 MG Caps Take by mouth.   Krill Oil 1000 MG Caps Take by mouth.   lansoprazole 30 MG capsule Commonly known as: PREVACID Take 1 capsule (30 mg total) by mouth daily. OFFICE VISIT REQUIRED   loratadine 10 MG tablet Commonly known as: CLARITIN Take 10 mg by mouth.   Multi-Vitamins Tabs Take by mouth.   norethindrone-ethinyl estradiol 1-5 MG-MCG Tabs tablet Commonly known as: FEMHRT 1/5 Take 1 tablet by mouth daily.   tamsulosin 0.4 MG Caps capsule Commonly known as: FLOMAX Take 1 capsule (0.4 mg total) by mouth daily.   triamcinolone cream 0.1 % Commonly known as: KENALOG Apply topically.        Allergies:  Allergies  Allergen Reactions   Esomeprazole Magnesium Other (See Comments)  cystitis   Etodolac Other (See Comments)    Flushed, bp changes   Levofloxacin Other (See Comments)    Agitation, insomnia   Sulfa Antibiotics Other (See Comments)    Urinary retention    Family History: Family History  Problem Relation Age of Onset   Hypertension Mother    Cancer Mother    Diabetes Mellitus II Father    Melanoma Father    Diabetes Sister    Hypertension Sister    Stroke Maternal Grandfather     Social  History:  reports that she has never smoked. She has never been exposed to tobacco smoke. She has never used smokeless tobacco. She reports that she does not drink alcohol and does not use drugs.   Physical Exam: BP 137/86   Pulse 81   Ht 5\' 4"  (1.626 m)   Wt 141 lb (64 kg)   BMI 24.20 kg/m   Constitutional:  Alert and oriented, No acute distress. HEENT: [place] AT Respiratory: Normal respiratory effort, no increased work of breathing. Skin: No rashes, bruises or suspicious lesions. Neurologic: Grossly intact, no focal deficits, moving all 4 extremities. Psychiatric: Normal mood and affect.   Assessment & Plan:    Obstructive voiding symptoms Unable to perform cystoscopy due to urethral narrowing Previously required dilation and was symptom-free for several years until recently Offered dilation in office versus same-day surgery under sedation and she elected the latter   Riki Altes, MD  [facility] [address] [place], [place] [address] [phone]

## 2022-05-22 NOTE — Patient Instructions (Signed)
Melissa will call to schedule 

## 2022-05-25 ENCOUNTER — Telehealth: Payer: Self-pay

## 2022-05-25 NOTE — Telephone Encounter (Signed)
I spoke with Charlotte Conrad. We have discussed possible surgery dates and Tuesday June 4th, 2024 was agreed upon by all parties. Patient given information about surgery date, what to expect pre-operatively and post operatively.  We discussed that a Pre-Admission Testing office will be calling to set up the pre-op visit that will take place prior to surgery, and that these appointments are typically done over the phone with a Pre-Admissions RN. Informed patient that our office will communicate any additional care to be provided after surgery. Patients questions or concerns were discussed during our call. Advised to call our office should there be any additional information, questions or concerns that arise. Patient verbalized understanding.

## 2022-05-25 NOTE — Progress Notes (Signed)
   [facility] Posting Form  Surgery Date: Date: 06/16/2022  Surgeon: Dr. Irineo Axon, MD  Inpt ( No  )   Outpt (Yes)   Obs ( No  )   Diagnosis: R39.11 Urinary Hesitancy  -CPT: [address]  Surgery: Cystoscopy with Urethral Dilation  Stop Anticoagulations: N/A  Cardiac/Medical/Pulmonary Clearance needed: no  *Orders entered into EPIC  Date: 05/25/22   *Case booked in EPIC  Date: 05/25/22  *Notified pt of Surgery: Date: 05/25/22  PRE-OP UA & CX: no  *Placed into Prior Authorization Work Que Date: 05/25/22  Assistant/laser/rep:No

## 2022-06-08 ENCOUNTER — Inpatient Hospital Stay: Admission: RE | Admit: 2022-06-08 | Payer: BC Managed Care – PPO | Source: Ambulatory Visit

## 2022-06-09 ENCOUNTER — Other Ambulatory Visit: Payer: Self-pay

## 2022-06-09 ENCOUNTER — Encounter
Admission: RE | Admit: 2022-06-09 | Discharge: 2022-06-09 | Disposition: A | Discharge status: Home / Self Care | Payer: BC Managed Care – PPO | Source: Ambulatory Visit | Attending: Urology | Admitting: Urology

## 2022-06-09 HISTORY — DX: Prediabetes: R73.03

## 2022-06-09 HISTORY — DX: Other complications of anesthesia, initial encounter: T88.59XA

## 2022-06-09 HISTORY — DX: Hyperlipidemia, unspecified: E78.5

## 2022-06-09 HISTORY — DX: Lichen sclerosus et atrophicus: L90.0

## 2022-06-09 HISTORY — DX: Unspecified osteoarthritis, unspecified site: M19.90

## 2022-06-09 NOTE — Patient Instructions (Addendum)
Your procedure is scheduled on: 06/16/22 - Tuesday Report to the Registration Desk on the 1st floor of the [facility]. To find out your arrival time, please call [phone] between 1PM - 3PM on: 06/15/22 - Monday If your arrival time is 6:00 am, do not arrive before that time as the [facility] Mall entrance doors do not open until 6:00 am.  REMEMBER: Instructions that are not followed completely may result in serious medical risk, up to and including death; or upon the discretion of your surgeon and anesthesiologist your surgery may need to be rescheduled.  Do not eat food or drink any liquids after midnight the night before surgery.  No gum chewing or hard candies.  One week prior to surgery: Stop  taking beginning 06/09/22, any Anti-inflammatories (NSAIDS) such as Advil, Aleve, Ibuprofen, Motrin, Naproxen, Naprosyn and Aspirin based products such as Excedrin, Goody's Powder, BC Powder.   Stop taking beginning 06/09/22, ANY OVER THE COUNTER supplements until after surgery.  You may take Tylenol if needed for pain up until the day of surgery.  TAKE ONLY THESE MEDICATIONS THE MORNING OF SURGERY WITH A SIP OF WATER:  FEMHRT  2.   lansoprazole (PREVACID)  (take one the night before and one on the morning of surgery - helps to prevent nausea after surgery.)  3.  Lifitegrast (XIIDRA) 5 % SOLN    No Alcohol for 24 hours before or after surgery.  No Smoking including e-cigarettes for 24 hours before surgery.  No chewable tobacco products for at least 6 hours before surgery.  No nicotine patches on the day of surgery.  Do not use any "recreational" drugs for at least a week (preferably 2 weeks) before your surgery.  Please be advised that the combination of cocaine and anesthesia may have negative outcomes, up to and including death. If you test positive for cocaine, your surgery will be cancelled.  On the morning of surgery brush your teeth with toothpaste and water, you may rinse your  mouth with mouthwash if you wish.  Do not swallow any toothpaste or mouthwash.  Do not wear jewelry, make-up, hairpins, clips or nail polish.  Do not wear lotions, powders, or perfumes.   Do not shave body hair from the neck down 48 hours before surgery.  Contact lenses, hearing aids and dentures may not be worn into surgery.  Do not bring valuables to the hospital. [facility] is not responsible for any missing/lost belongings or valuables.   Notify your doctor if there is any change in your medical condition (cold, fever, infection).  Wear comfortable clothing (specific to your surgery type) to the hospital.  After surgery, you can help prevent lung complications by doing breathing exercises.  Take deep breaths and cough every 1-2 hours. Your doctor may order a device called an Incentive Spirometer to help you take deep breaths. When coughing or sneezing, hold a pillow firmly against your incision with both hands. This is called "splinting." Doing this helps protect your incision. It also decreases belly discomfort.  If you are being admitted to the hospital overnight, leave your suitcase in the car. After surgery it may be brought to your room.  In case of increased patient census, it may be necessary for you, the patient, to continue your postoperative care in the Same Day Surgery department.  If you are being discharged the day of surgery, you will not be allowed to drive home. You will need a responsible individual to drive you home and stay  with you for 24 hours after surgery.   If you are taking public transportation, you will need to have a responsible individual with you.  Please call the Pre-admissions Testing Dept. at [phone] if you have any questions about these instructions.  Surgery Visitation Policy:  Patients having surgery or a procedure may have two visitors.  Children under the age of 47 must have an adult with them who is not the patient.  Inpatient  Visitation:    Visiting hours are 7 a.m. to 8 p.m. Up to four visitors are allowed at one time in a patient room. The visitors may rotate out with other people during the day.  One visitor age 70 or older may stay with the patient overnight and must be in the room by 8 p.m.

## 2022-06-15 MED ORDER — CHLORHEXIDINE GLUCONATE 0.12 % MT SOLN
15.0000 mL | Freq: Once | OROMUCOSAL | Status: AC
  Administered 2022-06-16: 15 mL via OROMUCOSAL

## 2022-06-15 MED ORDER — ORAL CARE MOUTH RINSE: 15.0000 mL | Freq: Once | OROMUCOSAL | Status: AC

## 2022-06-15 MED ORDER — CEFAZOLIN SODIUM-DEXTROSE 2-4 GM/100ML-% IV SOLN
2.0000 g | INTRAVENOUS | Status: AC
  Administered 2022-06-16: 2 g via INTRAVENOUS

## 2022-06-15 MED ORDER — LACTATED RINGERS IV SOLN: INTRAVENOUS | Status: DC

## 2022-06-16 ENCOUNTER — Ambulatory Visit: Payer: BC Managed Care – PPO | Admitting: Urgent Care

## 2022-06-16 ENCOUNTER — Ambulatory Visit
Admission: RE | Admit: 2022-06-16 | Discharge: 2022-06-16 | Disposition: A | Discharge status: Home / Self Care | Payer: BC Managed Care – PPO | Source: Ambulatory Visit | Attending: Urology | Admitting: Urology

## 2022-06-16 ENCOUNTER — Encounter: Payer: Self-pay | Admitting: Urology

## 2022-06-16 ENCOUNTER — Encounter
Admission: RE | Disposition: A | Discharge status: Home / Self Care | Payer: Self-pay | Source: Ambulatory Visit | Attending: Urology

## 2022-06-16 ENCOUNTER — Other Ambulatory Visit: Payer: Self-pay

## 2022-06-16 DIAGNOSIS — N3582 Other urethral stricture, female: Secondary | ICD-10-CM

## 2022-06-16 DIAGNOSIS — R3911 Hesitancy of micturition: Secondary | ICD-10-CM

## 2022-06-16 DIAGNOSIS — N3592 Unspecified urethral stricture, female: Secondary | ICD-10-CM | POA: Diagnosis present

## 2022-06-16 DIAGNOSIS — K219 Gastro-esophageal reflux disease without esophagitis: Secondary | ICD-10-CM | POA: Diagnosis not present

## 2022-06-16 HISTORY — PX: CYSTOSCOPY WITH URETHRAL DILATATION: SHX5125

## 2022-06-16 SURGERY — CYSTOSCOPY, WITH URETHRAL DILATION
Anesthesia: General

## 2022-06-16 MED ORDER — FENTANYL CITRATE (PF) 100 MCG/2ML IJ SOLN: 25.0000 ug | INTRAMUSCULAR | Status: DC | PRN

## 2022-06-16 MED ORDER — OXYCODONE HCL 5 MG PO TABS: 5.0000 mg | ORAL_TABLET | Freq: Once | ORAL | Status: DC | PRN

## 2022-06-16 MED ORDER — PHENAZOPYRIDINE HCL 200 MG PO TABS: 200.0000 mg | ORAL_TABLET | Freq: Three times a day (TID) | ORAL | 0 refills | Status: DC | PRN

## 2022-06-16 MED ORDER — OXYCODONE HCL 5 MG/5ML PO SOLN: 5.0000 mg | Freq: Once | ORAL | Status: DC | PRN

## 2022-06-16 MED ORDER — FENTANYL CITRATE (PF) 100 MCG/2ML IJ SOLN
INTRAMUSCULAR | Status: DC | PRN
  Administered 2022-06-16 (×2): 50 ug via INTRAVENOUS

## 2022-06-16 MED ORDER — CHLORHEXIDINE GLUCONATE 0.12 % MT SOLN
OROMUCOSAL | Status: AC
  Filled 2022-06-16: qty 15

## 2022-06-16 MED ORDER — PROMETHAZINE HCL 25 MG/ML IJ SOLN: 6.2500 mg | INTRAMUSCULAR | Status: DC | PRN

## 2022-06-16 MED ORDER — PROPOFOL 500 MG/50ML IV EMUL
INTRAVENOUS | Status: DC | PRN
  Administered 2022-06-16: 150 ug/kg/min via INTRAVENOUS
  Administered 2022-06-16: 40 mg via INTRAVENOUS

## 2022-06-16 MED ORDER — ACETAMINOPHEN 10 MG/ML IV SOLN: 1000.0000 mg | Freq: Once | INTRAVENOUS | Status: DC | PRN

## 2022-06-16 MED ORDER — CEFAZOLIN SODIUM-DEXTROSE 2-4 GM/100ML-% IV SOLN
INTRAVENOUS | Status: AC
  Filled 2022-06-16: qty 100

## 2022-06-16 MED ORDER — ONDANSETRON HCL 4 MG/2ML IJ SOLN
INTRAMUSCULAR | Status: DC | PRN
  Administered 2022-06-16: 4 mg via INTRAVENOUS

## 2022-06-16 MED ORDER — STERILE WATER FOR IRRIGATION IR SOLN
Status: DC | PRN
  Administered 2022-06-16: 1000 mL

## 2022-06-16 MED ORDER — FENTANYL CITRATE (PF) 100 MCG/2ML IJ SOLN
INTRAMUSCULAR | Status: AC
  Filled 2022-06-16: qty 2

## 2022-06-16 MED ORDER — DROPERIDOL 2.5 MG/ML IJ SOLN: 0.6250 mg | Freq: Once | INTRAMUSCULAR | Status: DC | PRN

## 2022-06-16 SURGICAL SUPPLY — 23 items
BAG DRN RND TRDRP ANRFLXCHMBR (UROLOGICAL SUPPLIES) ×1
BAG URINE DRAIN 2000ML AR STRL (UROLOGICAL SUPPLIES) ×1 IMPLANT
BALLN OPTILUME DCB 24X5X75 (BALLOONS)
BALLN OPTILUME DCB 30X3X75 (BALLOONS)
BALLN OPTILUME DCB 30X5X75 (BALLOONS)
BALLOON OPTILUME DCB 24X5X75 (BALLOONS) IMPLANT
BALLOON OPTILUME DCB 30X3X75 (BALLOONS) IMPLANT
BALLOON OPTILUME DCB 30X5X75 (BALLOONS) IMPLANT
CATH FOL 2WAY LX 16X5 (CATHETERS) ×1 IMPLANT
CATH FOLEY 2W COUNCIL 20FR 5CC (CATHETERS) IMPLANT
CATH SET URETHRAL DILATOR (CATHETERS) ×1 IMPLANT
ELECT REM PT RETURN 9FT ADLT (ELECTROSURGICAL) ×1
ELECTRODE REM PT RTRN 9FT ADLT (ELECTROSURGICAL) ×1 IMPLANT
GLOVE BIOGEL PI IND STRL 7.5 (GLOVE) ×1 IMPLANT
GOWN STRL REUS W/ TWL XL LVL3 (GOWN DISPOSABLE) ×2 IMPLANT
GOWN STRL REUS W/TWL XL LVL3 (GOWN DISPOSABLE) ×2
GUIDEWIRE STR DUAL SENSOR (WIRE) ×1 IMPLANT
HOLDER FOLEY CATH W/STRAP (MISCELLANEOUS) ×1 IMPLANT
PACK CYSTO AR (MISCELLANEOUS) ×1 IMPLANT
SET CYSTO W/LG BORE CLAMP LF (SET/KITS/TRAYS/PACK) ×1 IMPLANT
SYR 30ML LL (SYRINGE) ×1 IMPLANT
WATER STERILE IRR 3000ML UROMA (IV SOLUTION) ×1 IMPLANT
WATER STERILE IRR 500ML POUR (IV SOLUTION) ×1 IMPLANT

## 2022-06-16 NOTE — Discharge Instructions (Addendum)
AMBULATORY SURGERY  DISCHARGE INSTRUCTIONS   The drugs that you were given will stay in your system until tomorrow so for the next 24 hours you should not:  Drive an automobile Make any legal decisions Drink any alcoholic beverage   You may resume regular meals tomorrow.  Today it is better to start with liquids and gradually work up to solid foods.  You may eat anything you prefer, but it is better to start with liquids, then soup and crackers, and gradually work up to solid foods.   Please notify your doctor immediately if you have any unusual bleeding, trouble breathing, redness and pain at the surgery site, drainage, fever, or pain not relieved by medication.     Your post-operative visit with Dr.                                       is: Date:                        Time:    Please call to schedule your post-operative visit.  Additional Instructions: You may notice blood in the urine or blood on the toilet tissue when wiping Burning with urination will be normal Rx Pyridium was sent to pharmacy if needed for burning with urination.  You may also take over-the-counter AZO for burning if you do not need the Rx You will be contacted by our office for a postop appointment in approximately 1 month

## 2022-06-16 NOTE — Anesthesia Preprocedure Evaluation (Signed)
Anesthesia Evaluation  Patient identified by MRN, date of birth, ID band Patient awake    Reviewed: Allergy & Precautions, H&P , NPO status , Patient's Chart, lab work & pertinent test results  History of Anesthesia Complications (+) PROLONGED EMERGENCE and history of anesthetic complications  Airway Mallampati: II  TM Distance: >3 FB Neck ROM: full    Dental  (+) Poor Dentition,    Pulmonary neg pulmonary ROS   Pulmonary exam normal        Cardiovascular negative cardio ROS Normal cardiovascular exam     Neuro/Psych negative neurological ROS  negative psych ROS   GI/Hepatic Neg liver ROS,GERD  Controlled and Medicated,,  Endo/Other  negative endocrine ROS    Renal/GU negative Renal ROS  negative genitourinary   Musculoskeletal   Abdominal Normal abdominal exam  (+)   Peds  Hematology negative hematology ROS (+)   Anesthesia Other Findings Past Medical History: 05/23/2013: Anemia No date: Arthritis No date: Complication of anesthesia     Comment:  had a difficult waking up 10/24/2014: Eczema 10/24/2014: Elevated TSH No date: GERD (gastroesophageal reflux disease) No date: HLD (hyperlipidemia) 05/31/2012: Incomplete emptying of bladder No date: Lichen sclerosus 08/08/2013: Nocturia No date: Pre-diabetes  Past Surgical History: No date: ADENOIDECTOMY 2016: COLONOSCOPY No date: DILATION AND CURETTAGE, DIAGNOSTIC / THERAPEUTIC 2014: dilation of urethra 08/27/2016: ESOPHAGEAL DILATION; N/A     Comment:  Procedure: ESOPHAGEAL DILATION;  Surgeon: Midge Minium,               MD;  Location: [facility];  Service:               Gastroenterology;  Laterality: N/A; 08/27/2016: ESOPHAGOGASTRODUODENOSCOPY (EGD) WITH PROPOFOL; N/A     Comment:  Procedure: ESOPHAGOGASTRODUODENOSCOPY (EGD) WITH               PROPOFOL;  Surgeon: Midge Minium, MD;  Location: [facility]               [facility];  Service:  Gastroenterology;  Laterality:               N/A; No date: UPPER GI ENDOSCOPY     Comment:  02/01/2003, 02/19//2013, 12/02/2011  BMI    Body Mass Index: 23.86 kg/m      Reproductive/Obstetrics negative OB ROS                              Anesthesia Physical Anesthesia Plan  ASA: 2  Anesthesia Plan: General   Post-op Pain Management: Minimal or no pain anticipated   Induction: Intravenous  PONV Risk Score and Plan: Propofol infusion and TIVA  Airway Management Planned: Natural Airway  Additional Equipment:   Intra-op Plan:   Post-operative Plan:   Informed Consent: I have reviewed the patients History and Physical, chart, labs and discussed the procedure including the risks, benefits and alternatives for the proposed anesthesia with the patient or authorized representative who has indicated his/her understanding and acceptance.     Dental Advisory Given  Plan Discussed with: CRNA and Surgeon  Anesthesia Plan Comments:          Anesthesia Quick Evaluation

## 2022-06-16 NOTE — Interval H&P Note (Signed)
History and Physical Interval Note:  CV: RRR Lungs: Clear  06/16/2022 10:16 AM  Charlotte Conrad  has presented today for surgery, with the diagnosis of Urinary Hesitancy.  The various methods of treatment have been discussed with the patient and family. After consideration of risks, benefits and other options for treatment, the patient has consented to  Procedure(s): CYSTOSCOPY WITH URETHRAL DILATATION (N/A) as a surgical intervention.  The patient's history has been reviewed, patient examined, no change in status, stable for surgery.  I have reviewed the patient's chart and labs.  Questions were answered to the patient's satisfaction.     Jourdan Durbin C Clarke Peretz

## 2022-06-16 NOTE — Transfer of Care (Signed)
Immediate Anesthesia Transfer of Care Note  Patient: Charlotte Conrad  Procedure(s) Performed: CYSTOSCOPY WITH URETHRAL DILATATION  Patient Location: PACU  Anesthesia Type:General  Level of Consciousness: awake, alert , and oriented  Airway & Oxygen Therapy: Patient Spontanous Breathing  Post-op Assessment: Report given to RN and Post -op Vital signs reviewed and stable  Post vital signs: stable  Last Vitals:  Vitals Value Taken Time  BP    Temp    Pulse 85 06/16/22 1212  Resp 17 06/16/22 1212  SpO2 96 % 06/16/22 1212  Vitals shown include unvalidated device data.  Last Pain:  Vitals:   06/16/22 0859  TempSrc: Temporal  PainSc: 0-No pain         Complications: No notable events documented.

## 2022-06-16 NOTE — Op Note (Signed)
   Preoperative diagnosis:  Urethral stricture  Postoperative diagnosis:  Same  Procedure: Dilation urethral stricture Cystoscopy  Surgeon: Riki Altes, MD  Anesthesia: MAC  Complications: None  Intraoperative findings:  Bladder grossly normal in appearance without erythema, solid or papillary lesions.  Mild diffuse hyperemia bladder mucosa.  UOs normal-appearing bilaterally  EBL: Minimal  Specimens: None  Indication: Charlotte Conrad is a 57 y.o. patient with a history of urethral stricture status post dilation and 2014.  She developed obstructive voiding symptoms which were worse at night.  No improvement on an alpha-blocker and office cystoscopy unable to be performed as the cystoscope would not advance in the bladder.  She declined in office dilation.  After reviewing the management options for treatment, he elected to proceed with the above surgical procedure(s). We have discussed the potential benefits and risks of the procedure, side effects of the proposed treatment, the likelihood of the patient achieving the goals of the procedure, and any potential problems that might occur during the procedure or recuperation. Informed consent has been obtained.  Description of procedure:  The patient was taken to the operating room and transferred to the OR table.  Sedation was obtained by anesthesia and the patient was placed in the dorsal lithotomy position, prepped and draped in the usual sterile fashion, and preoperative antibiotics were administered. A preoperative time-out was performed.   A 12 [place] Walther sound was passed per urethra.  Resistance was met in the region of the mid urethra and the sound was advanced into the bladder with mild pressure.  The urethra was subsequently dilated to 24 [place].  A 21 French cystoscope was lubricated and passed per urethra.  Panendoscopy was performed with findings as described above.  The bladder was emptied and all instruments were  removed.  She was transferred to the PACU in stable condition.  Plan: Postop follow-up approximately 1 month   Riki Altes, M.D.

## 2022-06-17 ENCOUNTER — Encounter: Payer: Self-pay | Admitting: Urology

## 2022-06-17 NOTE — Anesthesia Postprocedure Evaluation (Signed)
Anesthesia Post Note  Patient: Charlotte Conrad  Procedure(s) Performed: CYSTOSCOPY WITH URETHRAL DILATATION  Patient location during evaluation: PACU Anesthesia Type: General Level of consciousness: awake and alert Pain management: pain level controlled Vital Signs Assessment: post-procedure vital signs reviewed and stable Respiratory status: spontaneous breathing, nonlabored ventilation and respiratory function stable Cardiovascular status: blood pressure returned to baseline and stable Postop Assessment: no apparent nausea or vomiting Anesthetic complications: no   There were no known notable events for this encounter.   Last Vitals:  Vitals:   06/16/22 1245 06/16/22 1304  BP: 130/82 (!) 140/77  Pulse: 67 67  Resp: 18 16  Temp: (!) 36.4 C 36.8 C  SpO2: 100% 100%    Last Pain:  Vitals:   06/17/22 0846  TempSrc:   PainSc: 0-No pain                 Foye Deer

## 2022-07-22 ENCOUNTER — Ambulatory Visit: Payer: BC Managed Care – PPO | Admitting: Urology

## 2022-07-22 ENCOUNTER — Encounter: Payer: Self-pay | Admitting: Urology

## 2022-07-22 VITALS — BP 136/78 | HR 82 | Ht 64.0 in | Wt 141.0 lb

## 2022-07-22 DIAGNOSIS — Z87448 Personal history of other diseases of urinary system: Secondary | ICD-10-CM | POA: Diagnosis not present

## 2022-07-22 DIAGNOSIS — Z9889 Other specified postprocedural states: Secondary | ICD-10-CM

## 2022-07-22 DIAGNOSIS — Z09 Encounter for follow-up examination after completed treatment for conditions other than malignant neoplasm: Secondary | ICD-10-CM | POA: Diagnosis not present

## 2022-07-22 DIAGNOSIS — R3911 Hesitancy of micturition: Secondary | ICD-10-CM | POA: Diagnosis not present

## 2022-07-22 MED ORDER — TAMSULOSIN HCL 0.4 MG PO CAPS: 0.4000 mg | ORAL_CAPSULE | Freq: Every day | ORAL | 0 refills | Status: AC

## 2022-07-22 NOTE — Progress Notes (Signed)
I, Duke Salvia, acting as a scribe for Riki Altes, MD., have documented all relevant documentation on the behalf of Riki Altes, MD, as directed by  Riki Altes, MD while in the presence of Riki Altes, MD.  07/22/2022 12:16 PM   Charlotte Conrad 11-Oct-1965 [id number]  Referring provider: Patrice Paradise, MD [address] [facility] [place]  [place] [address]  Chief Complaint  Patient presents with   urinary hesitancy    HPI: Charlotte Conrad is a 57 y.o. female presenting for one month post-op follow up.  Status post urethral stricture dilation 06/16/2022 She is voiding much better Doing well since last visit No bothersome LUTS Denies dysuria, gross hematuria Denies flank, abdominal or pelvic pain    PMH: Past Medical History:  Diagnosis Date   Anemia 05/23/2013   Arthritis    Complication of anesthesia    had a difficult waking up   Eczema 10/24/2014   Elevated TSH 10/24/2014   GERD (gastroesophageal reflux disease)    HLD (hyperlipidemia)    Incomplete emptying of bladder 05/31/2012   Lichen sclerosus    Nocturia 08/08/2013   Pre-diabetes     Surgical History: Past Surgical History:  Procedure Laterality Date   ADENOIDECTOMY     COLONOSCOPY  2016   CYSTOSCOPY WITH URETHRAL DILATATION N/A 06/16/2022   Procedure: CYSTOSCOPY WITH URETHRAL DILATATION;  Surgeon: Riki Altes, MD;  Location: [facility];  Service: Urology;  Laterality: N/A;   DILATION AND CURETTAGE, DIAGNOSTIC / THERAPEUTIC     dilation of urethra  2014   ESOPHAGEAL DILATION N/A 08/27/2016   Procedure: ESOPHAGEAL DILATION;  Surgeon: Midge Minium, MD;  Location: [facility];  Service: Gastroenterology;  Laterality: N/A;   ESOPHAGOGASTRODUODENOSCOPY (EGD) WITH PROPOFOL N/A 08/27/2016   Procedure: ESOPHAGOGASTRODUODENOSCOPY (EGD) WITH PROPOFOL;  Surgeon: Midge Minium, MD;  Location: [facility];  Service: Gastroenterology;  Laterality: N/A;   UPPER  GI ENDOSCOPY     02/01/2003, 02/19//2013, 12/02/2011    Home Medications:  Allergies as of 07/22/2022       Reactions   Esomeprazole Magnesium Other (See Comments)   cystitis   Etodolac Other (See Comments)   Flushed, bp changes   Levofloxacin Other (See Comments)   Agitation, insomnia   Protonix [pantoprazole] Other (See Comments)   cytitis   Sulfa Antibiotics Other (See Comments)   Urinary retention        Medication List        Accurate as of July 22, 2022 12:16 PM. If you have any questions, ask your nurse or doctor.          STOP taking these medications    phenazopyridine 200 MG tablet Commonly known as: PYRIDIUM Stopped by: Riki Altes, MD       TAKE these medications    acetaminophen 500 MG tablet Commonly known as: TYLENOL Take 1,000 mg by mouth every 6 (six) hours as needed.   Calcium High Potency/Vitamin D 600-5 MG-MCG Tabs Generic drug: Calcium Carb-Cholecalciferol Take by mouth 2 (two) times daily.   Cranberry 500 MG Caps Take by mouth in the morning and at bedtime.   Krill Oil 1000 MG Caps Take by mouth 2 (two) times daily.   lansoprazole 30 MG capsule Commonly known as: PREVACID Take 1 capsule (30 mg total) by mouth daily. OFFICE VISIT REQUIRED   loratadine 10 MG tablet Commonly known as: CLARITIN Take 10 mg by mouth.   Multi-Vitamins Tabs  Take by mouth.   norethindrone-ethinyl estradiol 1-5 MG-MCG Tabs tablet Commonly known as: FEMHRT 1/5 Take 1 tablet by mouth daily.   tamsulosin 0.4 MG Caps capsule Commonly known as: FLOMAX Take 1 capsule (0.4 mg total) by mouth daily. Started by: Riki Altes, MD   triamcinolone cream 0.1 % Commonly known as: KENALOG Apply topically as needed.   Vitamin D 50 MCG (2000 UT) Caps Take by mouth daily.   Xiidra 5 % Soln Generic drug: Lifitegrast Apply 1 drop to eye 2 (two) times daily. Both eyes        Allergies:  Allergies  Allergen Reactions   Esomeprazole Magnesium  Other (See Comments)    cystitis   Etodolac Other (See Comments)    Flushed, bp changes   Levofloxacin Other (See Comments)    Agitation, insomnia   Protonix [Pantoprazole] Other (See Comments)    cytitis   Sulfa Antibiotics Other (See Comments)    Urinary retention    Family History: Family History  Problem Relation Age of Onset   Hypertension Mother    Cancer Mother    Diabetes Mellitus II Father    Melanoma Father    Diabetes Sister    Hypertension Sister    Stroke Maternal Grandfather     Social History:  reports that she has never smoked. She has never been exposed to tobacco smoke. She has never used smokeless tobacco. She reports that she does not drink alcohol and does not use drugs.   Physical Exam: BP 136/78   Pulse 82   Ht 5\' 4"  (1.626 m)   Wt 141 lb (64 kg)   LMP 08/17/2016 (Exact Date) Comment: Preg Test Negative  BMI 24.20 kg/m   Constitutional:  Alert and oriented, No acute distress. HEENT: [place] AT Respiratory: Normal respiratory effort, no increased work of breathing. Psychiatric: Normal mood and affect.   Assessment & Plan:   Doing well status post urethral dilation. Offered her 6 month vs PRN follow up and she has elected to schedule a 6 month follow up. Instructed to call earlier for recurrent voiding symptoms.  I have reviewed the above documentation for accuracy and completeness, and I agree with the above.   Riki Altes, MD  [facility] [address] [place], [place] [address] [phone]

## 2022-08-11 ENCOUNTER — Other Ambulatory Visit: Payer: Self-pay | Admitting: Gastroenterology

## 2022-09-21 ENCOUNTER — Encounter: Payer: Self-pay | Admitting: Gastroenterology

## 2022-09-21 ENCOUNTER — Ambulatory Visit: Payer: BC Managed Care – PPO | Admitting: Gastroenterology

## 2022-09-21 VITALS — BP 121/81 | HR 87 | Temp 98.4°F | Wt 143.0 lb

## 2022-09-21 DIAGNOSIS — K219 Gastro-esophageal reflux disease without esophagitis: Secondary | ICD-10-CM

## 2022-09-21 MED ORDER — LANSOPRAZOLE 30 MG PO CPDR: 30.0000 mg | DELAYED_RELEASE_CAPSULE | Freq: Every day | ORAL | 3 refills | Status: AC

## 2022-09-21 NOTE — Progress Notes (Signed)
Primary Care Physician: Patrice Paradise, MD  Primary Gastroenterologist:  Dr. Midge Minium  Chief Complaint  Patient presents with   Gastroesophageal Reflux    Pt reports good Sx control    HPI: Charlotte Conrad is a 57 y.o. female here with a history of GERD.  The patient has follow-up with me in the past for medication refills.  Patient has been treated with Prevacid in the past.  The patient denies any unexplained weight loss fevers chills nausea vomiting black stools or bloody stools.  Past Medical History:  Diagnosis Date   Anemia 05/23/2013   Arthritis    Complication of anesthesia    had a difficult waking up   Eczema 10/24/2014   Elevated TSH 10/24/2014   GERD (gastroesophageal reflux disease)    HLD (hyperlipidemia)    Incomplete emptying of bladder 05/31/2012   Lichen sclerosus    Nocturia 08/08/2013   Pre-diabetes     Current Outpatient Medications  Medication Sig Dispense Refill   acetaminophen (TYLENOL) 500 MG tablet Take 1,000 mg by mouth every 6 (six) hours as needed.     Calcium Carb-Cholecalciferol (CALCIUM HIGH POTENCY/VITAMIN D) 600-5 MG-MCG TABS Take by mouth 2 (two) times daily.     Cholecalciferol (VITAMIN D) 50 MCG (2000 UT) CAPS Take by mouth daily.     Cranberry 500 MG CAPS Take by mouth in the morning and at bedtime.     Krill Oil 1000 MG CAPS Take by mouth 2 (two) times daily.     lansoprazole (PREVACID) 30 MG capsule TAKE ONE CAPSULE BY MOUTH EVERY DAY (OFFICE VISIT NEEDED FOR REFILLS) 90 capsule 0   Lifitegrast (XIIDRA) 5 % SOLN Apply 1 drop to eye 2 (two) times daily. Both eyes     loratadine (CLARITIN) 10 MG tablet Take 10 mg by mouth.     Multiple Vitamin (MULTI-VITAMINS) TABS Take by mouth.     norethindrone-ethinyl estradiol (FEMHRT 1/5) 1-5 MG-MCG TABS tablet Take 1 tablet by mouth daily.     triamcinolone cream (KENALOG) 0.1 % Apply topically as needed.     tamsulosin (FLOMAX) 0.4 MG CAPS capsule Take 1 capsule (0.4 mg total) by  mouth daily. (Patient not taking: Reported on 09/21/2022) 30 capsule 0   No current facility-administered medications for this visit.    Allergies as of 09/21/2022 - Review Complete 09/21/2022  Allergen Reaction Noted   Esomeprazole magnesium Other (See Comments) 08/08/2013   Etodolac Other (See Comments) 04/01/2013   Levofloxacin Other (See Comments) 05/27/2012   Protonix [pantoprazole] Other (See Comments) 06/09/2022   Sulfa antibiotics Other (See Comments) 05/30/2012    ROS:  General: Negative for anorexia, weight loss, fever, chills, fatigue, weakness. ENT: Negative for hoarseness, difficulty swallowing , nasal congestion. CV: Negative for chest pain, angina, palpitations, dyspnea on exertion, peripheral edema.  Respiratory: Negative for dyspnea at rest, dyspnea on exertion, cough, sputum, wheezing.  GI: See history of present illness. GU:  Negative for dysuria, hematuria, urinary incontinence, urinary frequency, nocturnal urination.  Endo: Negative for unusual weight change.    Physical Examination:   BP 121/81 (BP Location: Left Arm, Patient Position: Sitting, Cuff Size: Normal)   Pulse 87   Temp 98.4 F (36.9 C) (Oral)   Wt 143 lb (64.9 kg)   LMP 08/17/2016 (Exact Date) Comment: Preg Test Negative  BMI 24.55 kg/m   General: Well-nourished, well-developed in no acute distress.  Eyes: No icterus. Conjunctivae pink. Neuro: Alert and oriented x 3.  Grossly intact.  Skin: Warm and dry, no jaundice.   Psych: Alert and cooperative, normal mood and affect.  Labs:    Imaging Studies: No results found.  Assessment and Plan:   Charlotte Conrad is a 57 y.o. y/o female who comes in with a history of GERD.  The patient has been doing well on her pantoprazole.  The patient states that she will be in need of a colonoscopy next year.  The patient denies any nausea vomiting fevers chills black stools or any other worry symptoms.  The patient will follow-up in 1 years time.  The  patient has been explained the plan and agrees with it.     Midge Minium, MD. Clementeen Graham    Note: This dictation was prepared with Dragon dictation along with smaller phrase technology. Any transcriptional errors that result from this process are unintentional.

## 2022-11-03 ENCOUNTER — Other Ambulatory Visit: Payer: Self-pay | Admitting: Gastroenterology

## 2023-01-25 ENCOUNTER — Ambulatory Visit: Payer: BC Managed Care – PPO | Admitting: Urology

## 2023-04-19 ENCOUNTER — Telehealth: Payer: Self-pay | Admitting: Gastroenterology

## 2023-04-19 NOTE — Telephone Encounter (Signed)
 The patient called in wanting to schedule a medicine check.

## 2023-04-20 NOTE — Telephone Encounter (Signed)
 I spoke to pt regarding her scheduling a f/u OV with Dr Servando Snare... Pt is aware that Dr Servando Snare is no longer seeing patients in the office and that the practice is currently looking for physician replacements. Pt would not be due for a f/u until after September... I let pt know that I will set a recall reminder for the end of July so that we can revisit if there are any available physicians to establish care with at that time... Pt also advised that she can be referred to another GI office if she would prefer... Pt declines referral at this time and states she will wait it out to establish with another provider in the future if available

## 2024-05-16 ENCOUNTER — Ambulatory Visit: Admit: 2024-05-16 | Payer: Self-pay | Admitting: Gastroenterology

## 2024-05-16 SURGERY — COLONOSCOPY
Anesthesia: General
